# Patient Record
Sex: Male | Born: 1990 | Race: White | Hispanic: No | Marital: Single | Smoking: Never smoker
Health system: Southern US, Community
[De-identification: ages and names within clinical notes are randomized; demographics above are authoritative.]

## PROBLEM LIST (undated history)

## (undated) DIAGNOSIS — K219 Gastro-esophageal reflux disease without esophagitis: Secondary | ICD-10-CM

## (undated) DIAGNOSIS — F329 Major depressive disorder, single episode, unspecified: Secondary | ICD-10-CM

## (undated) DIAGNOSIS — R51 Headache: Secondary | ICD-10-CM

## (undated) DIAGNOSIS — E669 Obesity, unspecified: Secondary | ICD-10-CM

## (undated) DIAGNOSIS — R519 Headache, unspecified: Secondary | ICD-10-CM

## (undated) DIAGNOSIS — F32A Depression, unspecified: Secondary | ICD-10-CM

## (undated) HISTORY — DX: Major depressive disorder, single episode, unspecified: F32.9

## (undated) HISTORY — DX: Depression, unspecified: F32.A

## (undated) HISTORY — DX: Gastro-esophageal reflux disease without esophagitis: K21.9

## (undated) HISTORY — DX: Headache, unspecified: R51.9

## (undated) HISTORY — DX: Headache: R51

---
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
## 2011-04-08 ENCOUNTER — Inpatient Hospital Stay (INDEPENDENT_AMBULATORY_CARE_PROVIDER_SITE_OTHER)
Admission: RE | Admit: 2011-04-08 | Discharge: 2011-04-08 | Disposition: A | Discharge status: Home / Self Care | Payer: Self-pay | Source: Ambulatory Visit | Attending: Family Medicine | Admitting: Family Medicine

## 2011-04-08 DIAGNOSIS — B009 Herpesviral infection, unspecified: Secondary | ICD-10-CM

## 2012-01-24 ENCOUNTER — Emergency Department (HOSPITAL_COMMUNITY)
Admission: EM | Admit: 2012-01-24 | Discharge: 2012-01-24 | Disposition: A | Discharge status: Home / Self Care | Payer: Managed Care, Other (non HMO) | Source: Home / Self Care | Attending: Emergency Medicine | Admitting: Emergency Medicine

## 2012-01-24 NOTE — ED Notes (Signed)
Pt called x 1 no answer

## 2012-01-24 NOTE — ED Notes (Signed)
Pt called x2 no answer 

## 2012-01-25 ENCOUNTER — Encounter (HOSPITAL_COMMUNITY): Payer: Self-pay

## 2012-01-25 ENCOUNTER — Emergency Department (INDEPENDENT_AMBULATORY_CARE_PROVIDER_SITE_OTHER)
Admission: EM | Admit: 2012-01-25 | Discharge: 2012-01-25 | Disposition: A | Discharge status: Home Health | Payer: Managed Care, Other (non HMO) | Source: Home / Self Care | Attending: Family Medicine | Admitting: Family Medicine

## 2012-01-25 DIAGNOSIS — R112 Nausea with vomiting, unspecified: Secondary | ICD-10-CM

## 2012-01-25 HISTORY — DX: Obesity, unspecified: E66.9

## 2012-01-25 LAB — POCT I-STAT, CHEM 8
HCT: 51 % (ref 39.0–52.0)
Hemoglobin: 17.3 g/dL — ABNORMAL HIGH (ref 13.0–17.0)
Potassium: 3.5 mEq/L (ref 3.5–5.1)
Sodium: 140 mEq/L (ref 135–145)
TCO2: 27 mmol/L (ref 0–100)

## 2012-01-25 LAB — CBC
HCT: 47.7 % (ref 39.0–52.0)
MCHC: 34.4 g/dL (ref 30.0–36.0)
Platelets: 217 10*3/uL (ref 150–400)
RDW: 13.6 % (ref 11.5–15.5)

## 2012-01-25 MED ORDER — PROMETHAZINE HCL 25 MG PO TABS: 25.0000 mg | ORAL_TABLET | Freq: Four times a day (QID) | ORAL | Status: DC | PRN

## 2012-01-25 NOTE — ED Notes (Signed)
PT c/o abdominal pain for past 3 days with N/V/D.  Pt taking Pepto Bismol at home with no relief.  Pt states pain is generalized lower abdominal pain.

## 2012-01-25 NOTE — ED Provider Notes (Signed)
History     CSN: [id number]  Arrival date & time 01/25/12  4098   First MD Initiated Contact with Patient 01/25/12 0940      Chief Complaint  Patient presents with  . Abdominal Pain    (Consider location/radiation/quality/duration/timing/severity/associated sxs/prior treatment) HPI Comments: THE PATIENT REPORTS HAVING N/V/D X 3 DAYS. NO FEVER. NO BLOOD IN STOOL OR EMISIS. REPORTS CONSTANT LOWER ABD PAIN.. TOOK PEPTO LAST PM WITH SOME RELIEF.   The history is provided by the patient.    Past Medical History  Diagnosis Date  . Obesity     History reviewed. No pertinent past surgical history.  History reviewed. No pertinent family history.  History  Substance Use Topics  . Smoking status: Not on file  . Smokeless tobacco: Not on file  . Alcohol Use: No      Review of Systems  Constitutional: Positive for appetite change.  HENT: Negative.   Respiratory: Negative.   Cardiovascular: Negative.   Gastrointestinal: Positive for nausea, vomiting, abdominal pain and diarrhea. Negative for constipation, blood in stool and abdominal distention.  Genitourinary: Negative.   Musculoskeletal: Negative.   Skin: Negative.     Allergies  Review of patient's allergies indicates no known allergies.  Home Medications   Current Outpatient Rx  Name Route Sig Dispense Refill  . PROMETHAZINE HCL 25 MG PO TABS Oral Take 1 tablet (25 mg total) by mouth every 6 (six) hours as needed for nausea. 12 tablet 0    BP 133/85  Pulse 91  Temp(Src) 97.7 F (36.5 C) (Oral)  Resp 19  SpO2 96%  Physical Exam  Nursing note and vitals reviewed. Constitutional: He appears well-developed and well-nourished. He appears distressed.  HENT:  Head: Normocephalic and atraumatic.  Mouth/Throat: Oropharynx is clear and moist.  Neck: Normal range of motion. Neck supple. No thyromegaly present.  Cardiovascular: Normal rate and regular rhythm.   Pulmonary/Chest: Effort normal and breath sounds  normal.  Abdominal: Soft.       OBESE, GENERALIZED LOWER ABD TENDERNESS. NO REBOUNG. SLIGHT GUARDING. BS ACTIVE.   Musculoskeletal: Normal range of motion.  Skin: Skin is warm and dry.    ED Course  Procedures (including critical care time)  Labs Reviewed  POCT I-STAT, CHEM 8 - Abnormal; Notable for the following:    Glucose, Bld 108 (*)    Hemoglobin 17.3 (*)    All other components within normal limits  CBC   No results found.   1. Nausea vomiting and diarrhea       MDM          Randa Spike, MD 01/25/12 1025

## 2012-01-25 NOTE — Discharge Instructions (Signed)
CLEAR LIQUIDS , AVOID CAFFEINE AND MILK PRODUCTS. DIET AS DISCUSSED. B.R.A.T. Diet Your doctor has recommended the B.R.A.T. diet for you or your child until the condition improves. This is often used to help control diarrhea and vomiting symptoms. If you or your child can tolerate clear liquids, you may have:  Bananas.   Rice.   Applesauce.   Toast (and other simple starches such as crackers, potatoes, noodles).  Be sure to avoid dairy products, meats, and fatty foods until symptoms are better. Fruit juices such as apple, grape, and prune juice can make diarrhea worse. Avoid these. Continue this diet for 2 days or as instructed by your caregiver. Document Released: 09/25/2005 Document Revised: 09/14/2011 Document Reviewed: 03/14/2007 [facility] Patient Information 2012 [place], [place].

## 2012-07-12 ENCOUNTER — Encounter (HOSPITAL_COMMUNITY): Payer: Self-pay

## 2012-07-12 ENCOUNTER — Emergency Department (HOSPITAL_COMMUNITY)
Admission: EM | Admit: 2012-07-12 | Discharge: 2012-07-12 | Disposition: A | Discharge status: Home / Self Care | Payer: Managed Care, Other (non HMO) | Attending: Emergency Medicine | Admitting: Emergency Medicine

## 2012-07-12 ENCOUNTER — Emergency Department (HOSPITAL_COMMUNITY): Payer: Managed Care, Other (non HMO)

## 2012-07-12 DIAGNOSIS — E669 Obesity, unspecified: Secondary | ICD-10-CM | POA: Insufficient documentation

## 2012-07-12 DIAGNOSIS — Y998 Other external cause status: Secondary | ICD-10-CM | POA: Insufficient documentation

## 2012-07-12 DIAGNOSIS — W19XXXA Unspecified fall, initial encounter: Secondary | ICD-10-CM | POA: Insufficient documentation

## 2012-07-12 DIAGNOSIS — S99929A Unspecified injury of unspecified foot, initial encounter: Secondary | ICD-10-CM | POA: Insufficient documentation

## 2012-07-12 DIAGNOSIS — S8990XA Unspecified injury of unspecified lower leg, initial encounter: Secondary | ICD-10-CM | POA: Insufficient documentation

## 2012-07-12 DIAGNOSIS — Y9389 Activity, other specified: Secondary | ICD-10-CM | POA: Insufficient documentation

## 2012-07-12 MED ORDER — OXYCODONE-ACETAMINOPHEN 5-325 MG PO TABS
2.0000 | ORAL_TABLET | Freq: Once | ORAL | Status: AC
  Administered 2012-07-12: 2 via ORAL
  Filled 2012-07-12: qty 2

## 2012-07-12 MED ORDER — OXYCODONE-ACETAMINOPHEN 5-325 MG PO TABS: 1.0000 | ORAL_TABLET | Freq: Four times a day (QID) | ORAL | Status: DC | PRN

## 2012-07-12 NOTE — ED Notes (Signed)
Pt slipped and fell, injured his right knee sts he heard something popped,  no LOC. Now right knee pain 9/10. Pain worsen with movement. Limited ROM on right knee

## 2012-07-12 NOTE — ED Provider Notes (Signed)
History     CSN: [id number]  Arrival date & time 07/12/12  1203   First MD Initiated Contact with Patient 07/12/12 1255      Chief Complaint  Patient presents with  . Knee Pain    (Consider location/radiation/quality/duration/timing/severity/associated sxs/prior treatment) HPI Comments: Patient presents with right knee pain that occurred yesterday while helping his parents move. Patient states that while loading thing on to the trailer he stepped back and heard something pop. Patient states that the pain is 9/10 and worse with extension of the knee. Patient states that he cannot put pressure on the leg and has to "hop" around. Denies fever or chills. Denies numbness or tingling.  The history is provided by the patient. No language interpreter was used.    Past Medical History  Diagnosis Date  . Obesity     History reviewed. No pertinent past surgical history.  No family history on file.  History  Substance Use Topics  . Smoking status: Not on file  . Smokeless tobacco: Not on file  . Alcohol Use: No      Review of Systems  Constitutional: Negative for fever and chills.  Gastrointestinal: Negative for nausea, vomiting, abdominal pain and diarrhea.  Musculoskeletal: Positive for back pain and gait problem.  Neurological: Positive for numbness.    Allergies  Review of patient's allergies indicates no known allergies.  Home Medications   Current Outpatient Rx  Name Route Sig Dispense Refill  . HYDROCODONE-ACETAMINOPHEN 5-325 MG PO TABS Oral Take 0.5 tablets by mouth once as needed. Pain.    . OXYCODONE-ACETAMINOPHEN 5-325 MG PO TABS Oral Take 1 tablet by mouth every 6 (six) hours as needed for pain. 15 tablet 0    BP 136/73  Pulse 73  Temp 97.6 F (36.4 C) (Oral)  Resp 18  Wt 325 lb (147.419 kg)  SpO2 100%  Physical Exam  Nursing note and vitals reviewed. Constitutional: He appears well-developed and well-nourished. No distress.  HENT:  Head:  Normocephalic and atraumatic.  Mouth/Throat: Oropharynx is clear and moist.  Eyes: Conjunctivae normal and EOM are normal. No scleral icterus.  Neck: Normal range of motion. Neck supple.  Cardiovascular: Normal rate, regular rhythm, normal heart sounds and intact distal pulses.   Pulmonary/Chest: Effort normal and breath sounds normal.  Abdominal: Soft. Bowel sounds are normal.  Musculoskeletal: He exhibits tenderness. He exhibits no edema.       Patient has tenderness to palpation of the patella. Patient has pain with flexion. ROM limited by pain.  Neurological: He is alert. He exhibits normal muscle tone. Coordination abnormal.  Skin: Skin is warm and dry.    ED Course  Procedures (including critical care time)  Labs Reviewed - No data to display Dg Knee Complete 4 Views Right  07/12/2012  *RADIOLOGY REPORT*  Clinical Data: History of twisting injury.  Inability to straighten knee.  RIGHT KNEE - COMPLETE 4+ VIEW  Comparison: 07/22/2007.  Findings: No definite joint effusion is evident. Alignment is normal.  Joint spaces are preserved.  No fracture or dislocation is evident.  No soft tissue lesions are seen.  IMPRESSION: No fracture or dislocation is evident.   Original Report Authenticated By: Crawford Givens, M.D.      1. Knee injury       MDM  Patient presented with acute right knee pain after a fall while moving. Patient given pain medication in ED with improvement. Imaging unremarkable for dislocation or fracture. Patient put in knee immobilizer and given crutches  and pain medication. Patient referred to ortho on call for follow-up on Monday. Return precautions given verbally and in discharge summary.        Pixie Casino, PA-C 07/12/12 1821

## 2012-07-13 NOTE — ED Provider Notes (Signed)
Medical screening examination/treatment/procedure(s) were performed by non-physician practitioner and as supervising physician I was immediately available for consultation/collaboration.  Ashland Wiseman R. Tranesha Lessner, MD 07/13/12 0656 

## 2012-11-26 ENCOUNTER — Encounter: Payer: Self-pay | Admitting: Internal Medicine

## 2012-11-26 ENCOUNTER — Ambulatory Visit (INDEPENDENT_AMBULATORY_CARE_PROVIDER_SITE_OTHER): Payer: Managed Care, Other (non HMO) | Admitting: Internal Medicine

## 2012-11-26 VITALS — BP 140/90 | HR 88 | Temp 98.0°F | Resp 18 | Wt 342.0 lb

## 2012-11-26 DIAGNOSIS — Z0000 Encounter for general adult medical examination without abnormal findings: Secondary | ICD-10-CM

## 2012-11-26 DIAGNOSIS — E6609 Other obesity due to excess calories: Secondary | ICD-10-CM

## 2012-11-26 DIAGNOSIS — F329 Major depressive disorder, single episode, unspecified: Secondary | ICD-10-CM

## 2012-11-26 DIAGNOSIS — E669 Obesity, unspecified: Secondary | ICD-10-CM

## 2012-11-26 LAB — GLUCOSE, POCT (MANUAL RESULT ENTRY): POC Glucose: 107 mg/dl — AB (ref 70–99)

## 2012-11-26 MED ORDER — BUPROPION HCL ER (XL) 300 MG PO TB24: 300.0000 mg | ORAL_TABLET | Freq: Every day | ORAL | Status: DC

## 2012-11-26 NOTE — Progress Notes (Signed)
  Subjective:    Patient ID: Jermaine Wang, male    DOB: [birth year], 22 y.o.   MRN: [id number]  HPI   22 year old patient who is seen today to establish with our practice. His aunt Jeison Delpilar is followed at this practice. He was treated for depression at age 22 or 71 and did quite well on Wellbutrin at a dose of 300 mg daily. His chief complaint today is worsening depression. He also complains of some mild headaches and reflux symptoms. Past medical history is fairly unremarkable. No hospital admissions  Family history father has had 2 surgeries for a brain tumor, apparently benign and associated with hydrocephalus. He is 23 years of age with hypertension and otherwise does quite well Mother age 73 has hypertension and diabetes one sister is well.  Social history single nonsmoker born and raised in [place] graduated [employer]. 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  HENT: Negative for hearing loss, ear pain, congestion, sore throat, trouble swallowing, neck stiffness, dental problem, voice change and tinnitus.   Eyes: Negative for pain, discharge and visual disturbance.  Respiratory: Negative for cough, chest tightness, wheezing and stridor.   Cardiovascular: Negative for chest pain, palpitations and leg swelling.  Gastrointestinal: Negative for nausea, vomiting, abdominal pain, diarrhea, constipation, blood in stool and abdominal distention.  Genitourinary: Negative for urgency, hematuria, flank pain, discharge, difficulty urinating and genital sores.  Musculoskeletal: Negative for myalgias, back pain, joint swelling, arthralgias and gait problem.  Skin: Negative for rash.  Neurological: Positive for headaches. Negative for dizziness, syncope, speech difficulty, weakness and numbness.  Hematological: Negative for adenopathy. Does not bruise/bleed easily.   Psychiatric/Behavioral: Positive for dysphoric mood. Negative for behavioral problems. The patient is not nervous/anxious.        Objective:   Physical Exam  Constitutional: He appears well-developed and well-nourished.  Weight 342 Blood pressure 130/80  HENT:  Head: Normocephalic and atraumatic.  Right Ear: External ear normal.  Left Ear: External ear normal.  Nose: Nose normal.  Mouth/Throat: Oropharynx is clear and moist.  Eyes: Conjunctivae and EOM are normal. Pupils are equal, round, and reactive to light. No scleral icterus.  Neck: Normal range of motion. Neck supple. No JVD present. No thyromegaly present.  Cardiovascular: Regular rhythm, normal heart sounds and intact distal pulses.  Exam reveals no gallop and no friction rub.   No murmur heard. Pulmonary/Chest: Effort normal and breath sounds normal. He exhibits no tenderness.  Abdominal: Soft. Bowel sounds are normal. He exhibits no distension and no mass. There is no tenderness.  Musculoskeletal: Normal range of motion. He exhibits no edema and no tenderness.  Lymphadenopathy:    He has no cervical adenopathy.  Neurological: He is alert. He has normal reflexes. No cranial nerve deficit. Coordination normal.  Skin: Skin is warm and dry. No rash noted.  Psychiatric: He has a normal mood and affect. His behavior is normal.          Assessment & Plan:    preventive health examination Exogenous obesity. Exercise weight loss encouraged dietary instructions dispensed Recurrent depression. Will resume Wellbutrin with a final dose of 300 mg daily  Recheck 8 weeks

## 2012-11-26 NOTE — Patient Instructions (Signed)
Avoids foods high in acid such as tomatoes citrus juices, and spicy foods.  Avoid eating within two hours of lying down or before exercising.  Do not overheat.  Try smaller more frequent meals.  If symptoms persist, elevate the head of her bed 12 inches while sleeping.  Diet for Gastroesophageal Reflux Disease, Adult Reflux (acid reflux) is when acid from your stomach flows up into the esophagus. When acid comes in contact with the esophagus, the acid causes irritation and soreness (inflammation) in the esophagus. When reflux happens often or so severely that it causes damage to the esophagus, it is called gastroesophageal reflux disease (GERD). Nutrition therapy can help ease the discomfort of GERD. FOODS OR DRINKS TO AVOID OR LIMIT  Smoking or chewing tobacco. Nicotine is one of the most potent stimulants to acid production in the gastrointestinal tract.  Caffeinated and decaffeinated coffee and black tea.  Regular or low-calorie carbonated beverages or energy drinks (caffeine-free carbonated beverages are allowed).   Strong spices, such as black pepper, white pepper, red pepper, cayenne, curry powder, and chili powder.  Peppermint or spearmint.  Chocolate.  High-fat foods, including meats and fried foods. Extra added fats including oils, butter, salad dressings, and nuts. Limit these to less than 8 tsp per day.  Fruits and vegetables if they are not tolerated, such as citrus fruits or tomatoes.  Alcohol.  Any food that seems to aggravate your condition. If you have questions regarding your diet, call your caregiver or a registered dietitian. OTHER THINGS THAT MAY HELP GERD INCLUDE:   Eating your meals slowly, in a relaxed setting.  Eating 5 to 6 small meals per day instead of 3 large meals.  Eliminating food for a period of time if it causes distress.  Not lying down until 3 hours after eating a meal.  Keeping the head of your bed raised 6 to 9 inches (15 to 23 cm) by using a  foam wedge or blocks under the legs of the bed. Lying flat may make symptoms worse.  Being physically active. Weight loss may be helpful in reducing reflux in overweight or obese adults.  Wear loose fitting clothing EXAMPLE MEAL PLAN This meal plan is approximately 2,000 calories based on https://www.bernard.org/ meal planning guidelines. Breakfast   cup cooked oatmeal.  1 cup strawberries.  1 cup low-fat milk.  1 oz almonds. Snack  1 cup cucumber slices.  6 oz yogurt (made from low-fat or fat-free milk). Lunch  2 slice whole-wheat bread.  2 oz sliced [place].  2 tsp mayonnaise.  1 cup blueberries.  1 cup snap peas. Snack  6 whole-wheat crackers.  1 oz string cheese. Dinner   cup brown rice.  1 cup mixed veggies.  1 tsp olive oil.  3 oz grilled fish. Document Released: 09/25/2005 Document Revised: 12/18/2011 Document Reviewed: 08/11/2011 [facility] Patient Information 2013 [place], [place]. DASH Diet The DASH diet stands for "Dietary Approaches to Stop Hypertension." It is a healthy eating plan that has been shown to reduce high blood pressure (hypertension) in as little as 14 days, while also possibly providing other significant health benefits. These other health benefits include reducing the risk of breast cancer after menopause and reducing the risk of type 2 diabetes, heart disease, colon cancer, and stroke. Health benefits also include weight loss and slowing kidney failure in patients with chronic kidney disease.  DIET GUIDELINES  Limit salt (sodium). Your diet should contain less than 1500 mg of sodium daily.  Limit refined or processed carbohydrates.  Your diet should include mostly whole grains. Desserts and added sugars should be used sparingly.  Include small amounts of heart-healthy fats. These types of fats include nuts, oils, and tub margarine. Limit saturated and trans fats. These fats have been shown to be harmful in the body. CHOOSING FOODS  The  following food groups are based on a 2000 calorie diet. See your Registered Dietitian for individual calorie needs. Grains and Grain Products (6 to 8 servings daily)  Eat More Often: Whole-wheat bread, brown rice, whole-grain or wheat pasta, quinoa, popcorn without added fat or salt (air popped).  Eat Less Often: White bread, white pasta, white rice, cornbread. Vegetables (4 to 5 servings daily)  Eat More Often: Fresh, frozen, and canned vegetables. Vegetables may be raw, steamed, roasted, or grilled with a minimal amount of fat.  Eat Less Often/Avoid: Creamed or fried vegetables. Vegetables in a cheese sauce. Fruit (4 to 5 servings daily)  Eat More Often: All fresh, canned (in natural juice), or frozen fruits. Dried fruits without added sugar. One hundred percent fruit juice ( cup [237 mL] daily).  Eat Less Often: Dried fruits with added sugar. Canned fruit in light or heavy syrup. [employer], Fish, and Poultry (2 servings or less daily. One serving is 3 to 4 oz [85-114 g]).  Eat More Often: Ninety percent or leaner ground beef, tenderloin, sirloin. Round cuts of beef, chicken breast, [place] breast. All fish. Grill, bake, or broil your meat. Nothing should be fried.  Eat Less Often/Avoid: Fatty cuts of meat, [place], or chicken leg, thigh, or wing. Fried cuts of meat or fish. Dairy (2 to 3 servings)  Eat More Often: Low-fat or fat-free milk, low-fat plain or light yogurt, reduced-fat or part-skim cheese.  Eat Less Often/Avoid: Milk (whole, 2%).Whole milk yogurt. Full-fat cheeses. Nuts, Seeds, and Legumes (4 to 5 servings per week)  Eat More Often: All without added salt.  Eat Less Often/Avoid: Salted nuts and seeds, canned beans with added salt. Fats and Sweets (limited)  Eat More Often: Vegetable oils, tub margarines without trans fats, sugar-free gelatin. Mayonnaise and salad dressings.  Eat Less Often/Avoid: Coconut oils, palm oils, butter, stick margarine, cream, half and  half, cookies, candy, pie. FOR MORE INFORMATION The Dash Diet Eating Plan: www.dashdiet.org Document Released: 09/14/2011 Document Revised: 12/18/2011 Document Reviewed: 09/14/2011 [facility] Patient Information 2013 [place], [place].

## 2013-01-29 ENCOUNTER — Ambulatory Visit: Payer: Managed Care, Other (non HMO) | Admitting: Internal Medicine

## 2013-10-15 ENCOUNTER — Ambulatory Visit (INDEPENDENT_AMBULATORY_CARE_PROVIDER_SITE_OTHER): Payer: BC Managed Care – PPO | Admitting: Internal Medicine

## 2013-10-15 ENCOUNTER — Encounter: Payer: Self-pay | Admitting: Internal Medicine

## 2013-10-15 VITALS — BP 130/88 | HR 74 | Temp 98.1°F | Resp 20 | Wt 338.0 lb

## 2013-10-15 DIAGNOSIS — F329 Major depressive disorder, single episode, unspecified: Secondary | ICD-10-CM

## 2013-10-15 DIAGNOSIS — F32A Depression, unspecified: Secondary | ICD-10-CM

## 2013-10-15 DIAGNOSIS — E669 Obesity, unspecified: Secondary | ICD-10-CM

## 2013-10-15 DIAGNOSIS — B9789 Other viral agents as the cause of diseases classified elsewhere: Secondary | ICD-10-CM

## 2013-10-15 DIAGNOSIS — J069 Acute upper respiratory infection, unspecified: Secondary | ICD-10-CM

## 2013-10-15 DIAGNOSIS — F3289 Other specified depressive episodes: Secondary | ICD-10-CM

## 2013-10-15 DIAGNOSIS — E6609 Other obesity due to excess calories: Secondary | ICD-10-CM

## 2013-10-15 MED ORDER — HYDROCODONE-HOMATROPINE 5-1.5 MG/5ML PO SYRP: 5.0000 mL | ORAL_SOLUTION | Freq: Four times a day (QID) | ORAL | Status: AC | PRN

## 2013-10-15 NOTE — Progress Notes (Signed)
Subjective:    Patient ID: Jermaine Wang, male    DOB: 24-Jun-1991, 24 y.o.   MRN: [id number]  HPI  23 year old patient who presents with a four-day history of sore throat cough left ear discomfort. He has been using Robitussin-DM with some benefit. No fever. His wife has been ill.  Past Medical History  Diagnosis Date  . Obesity   . Depression   . Generalized headaches   . GERD (gastroesophageal reflux disease)     History   Social History  . Marital Status: Single    Spouse Name: N/A    Number of Children: N/A  . Years of Education: N/A   Occupational History  . Not on file.   Social History Main Topics  . Smoking status: Former Smoker    Types: Cigarettes    Quit date: 10/09/2006  . Smokeless tobacco: Never Used  . Alcohol Use: No  . Drug Use: No  . Sexual Activity: Not on file   Other Topics Concern  . Not on file   Social History Narrative  . No narrative on file    History reviewed. No pertinent past surgical history.  Family History  Problem Relation Age of Onset  . Hypertension Mother   . Heart disease Father   . Hypertension Father   . Cancer Paternal Grandmother   . Stroke Paternal Grandmother     No Known Allergies  Current Outpatient Prescriptions on File Prior to Visit  Medication Sig Dispense Refill  . buPROPion (WELLBUTRIN XL) 300 MG 24 hr tablet Take 1 tablet (300 mg total) by mouth daily.  90 tablet  2   No current facility-administered medications on file prior to visit.    BP 130/88  Pulse 74  Temp(Src) 98.1 F (36.7 C) (Oral)  Resp 20  Wt 338 lb (153.316 kg)  SpO2 98%       Review of Systems  Constitutional: Positive for activity change, appetite change and fatigue. Negative for fever and chills.  HENT: Positive for postnasal drip, rhinorrhea and sinus pressure. Negative for congestion, dental problem, ear pain, hearing loss, sore throat, tinnitus, trouble swallowing and voice change.   Eyes: Negative for pain, discharge  and visual disturbance.  Respiratory: Positive for cough. Negative for chest tightness, wheezing and stridor.   Cardiovascular: Negative for chest pain, palpitations and leg swelling.  Gastrointestinal: Negative for nausea, vomiting, abdominal pain, diarrhea, constipation, blood in stool and abdominal distention.  Genitourinary: Negative for urgency, hematuria, flank pain, discharge, difficulty urinating and genital sores.  Musculoskeletal: Negative for arthralgias, back pain, gait problem, joint swelling, myalgias and neck stiffness.  Skin: Negative for rash.  Neurological: Negative for dizziness, syncope, speech difficulty, weakness, numbness and headaches.  Hematological: Negative for adenopathy. Does not bruise/bleed easily.  Psychiatric/Behavioral: Negative for behavioral problems and dysphoric mood. The patient is not nervous/anxious.        Objective:   Physical Exam  Constitutional: He is oriented to person, place, and time. He appears well-developed.  Afebrile. No distress. Repeat blood pressure 130/80  HENT:  Head: Normocephalic.  Right Ear: External ear normal.  Left Ear: External ear normal.  Mild erythema with pharyngeal crowding  Eyes: Conjunctivae and EOM are normal.  Neck: Normal range of motion.  Cardiovascular: Normal rate and normal heart sounds.   Pulmonary/Chest: Breath sounds normal.  Abdominal: Bowel sounds are normal.  Musculoskeletal: Normal range of motion. He exhibits no edema and no tenderness.  Neurological: He is alert and oriented to person, place,  and time.  Psychiatric: He has a normal mood and affect. His behavior is normal.          Assessment & Plan:  Viral URI with cough. We'll treat symptomatically Exogenous obesity. Weight loss encouraged Depression stable

## 2013-10-15 NOTE — Patient Instructions (Signed)
Acute bronchitis symptoms for less than 10 days are generally not helped by antibiotics.  Take over-the-counter expectorants and cough medications such as  Mucinex DM.  Call if there is no improvement in 5 to 7 days or if he developed worsening cough, fever, or new symptoms, such as shortness of breath or chest pain.    

## 2013-10-15 NOTE — Progress Notes (Signed)
Pre-visit discussion using our clinic review tool. No additional management support is needed unless otherwise documented below in the visit note.  

## 2014-07-14 ENCOUNTER — Encounter: Payer: Self-pay | Admitting: Internal Medicine

## 2014-07-14 ENCOUNTER — Ambulatory Visit (INDEPENDENT_AMBULATORY_CARE_PROVIDER_SITE_OTHER): Payer: BC Managed Care – PPO | Admitting: Internal Medicine

## 2014-07-14 VITALS — BP 110/80 | HR 87 | Temp 97.8°F | Resp 20 | Ht 70.0 in | Wt 328.0 lb

## 2014-07-14 DIAGNOSIS — K529 Noninfective gastroenteritis and colitis, unspecified: Secondary | ICD-10-CM

## 2014-07-14 MED ORDER — DIPHENOXYLATE-ATROPINE 2.5-0.025 MG PO TABS: 1.0000 | ORAL_TABLET | Freq: Four times a day (QID) | ORAL | Status: DC | PRN

## 2014-07-14 NOTE — Patient Instructions (Signed)
Maintain hydration by drinking small amounts of clear fluids frequently, then soft diet, and then advance diet as tolerated. May use OTC Imodium if desired for any diarrhea.  Call if symptoms worsen, high fever, severe weakness or fainting, increased abdominal pain, blood in stool or vomit, or failure to improve in 2-3 days.Viral Gastroenteritis Viral gastroenteritis is also known as stomach flu. This condition affects the stomach and intestinal tract. It can cause sudden diarrhea and vomiting. The illness typically lasts 3 to 8 days. Most people develop an immune response that eventually gets rid of the virus. While this natural response develops, the virus can make you quite ill. CAUSES  Many different viruses can cause gastroenteritis, such as rotavirus or noroviruses. You can catch one of these viruses by consuming contaminated food or water. You may also catch a virus by sharing utensils or other personal items with an infected person or by touching a contaminated surface. SYMPTOMS  The most common symptoms are diarrhea and vomiting. These problems can cause a severe loss of body fluids (dehydration) and a body salt (electrolyte) imbalance. Other symptoms may include:  Fever.  Headache.  Fatigue.  Abdominal pain. DIAGNOSIS  Your caregiver can usually diagnose viral gastroenteritis based on your symptoms and a physical exam. A stool sample may also be taken to test for the presence of viruses or other infections. TREATMENT  This illness typically goes away on its own. Treatments are aimed at rehydration. The most serious cases of viral gastroenteritis involve vomiting so severely that you are not able to keep fluids down. In these cases, fluids must be given through an intravenous line (IV). HOME CARE INSTRUCTIONS   Drink enough fluids to keep your urine clear or pale yellow. Drink small amounts of fluids frequently and increase the amounts as tolerated.  Ask your caregiver for specific  rehydration instructions.  Avoid:  Foods high in sugar.  Alcohol.  Carbonated drinks.  Tobacco.  Juice.  Caffeine drinks.  Extremely hot or cold fluids.  Fatty, greasy foods.  Too much intake of anything at one time.  Dairy products until 24 to 48 hours after diarrhea stops.  You may consume probiotics. Probiotics are active cultures of beneficial bacteria. They may lessen the amount and number of diarrheal stools in adults. Probiotics can be found in yogurt with active cultures and in supplements.  Wash your hands well to avoid spreading the virus.  Only take over-the-counter or prescription medicines for pain, discomfort, or fever as directed by your caregiver. Do not give aspirin to children. Antidiarrheal medicines are not recommended.  Ask your caregiver if you should continue to take your regular prescribed and over-the-counter medicines.  Keep all follow-up appointments as directed by your caregiver. SEEK IMMEDIATE MEDICAL CARE IF:   You are unable to keep fluids down.  You do not urinate at least once every 6 to 8 hours.  You develop shortness of breath.  You notice blood in your stool or vomit. This may look like coffee grounds.  You have abdominal pain that increases or is concentrated in one small area (localized).  You have persistent vomiting or diarrhea.  You have a fever.  The patient is a child younger than 3 months, and he or she has a fever.  The patient is a child older than 3 months, and he or she has a fever and persistent symptoms.  The patient is a child older than 3 months, and he or she has a fever and symptoms suddenly get  worse.  The patient is a baby, and he or she has no tears when crying. MAKE SURE YOU:   Understand these instructions.  Will watch your condition.  Will get help right away if you are not doing well or get worse. Document Released: 09/25/2005 Document Revised: 12/18/2011 Document Reviewed:  07/12/2011 [facility] Patient Information 2015 [employer] [place]. This information is not intended to replace advice given to you by your health care provider. Make sure you discuss any questions you have with your health care provider.

## 2014-07-14 NOTE — Progress Notes (Signed)
Subjective:    Patient ID: Jermaine Wang, male    DOB: October 15, 1990, 23 y.o.   MRN: [id number]  HPI 23 year old patient who presents with a four-day history of watery diarrhea.  Symptoms are improving and yesterday he only had 4-5 loose bowel movements per 24 hour period early course of the illness.  He had some mild abdominal discomfort and rare nausea and vomiting.  Yesterday he returned to work and was able to work a full day.  No diarrhea throughout the night last night. Patient also complains of left-sided chest pain of several days' duration.  Described as a sharp, shooting pain to the left scapular area.  No shortness of breath. His job is very physically demanding in a warehouse  Past Medical History  Diagnosis Date  . Obesity   . Depression   . Generalized headaches   . GERD (gastroesophageal reflux disease)     History   Social History  . Marital Status: Single    Spouse Name: N/A    Number of Children: N/A  . Years of Education: N/A   Occupational History  . Not on file.   Social History Main Topics  . Smoking status: Former Smoker    Types: Cigarettes    Quit date: 10/09/2006  . Smokeless tobacco: Never Used  . Alcohol Use: No  . Drug Use: No  . Sexual Activity: Not on file   Other Topics Concern  . Not on file   Social History Narrative  . No narrative on file    History reviewed. No pertinent past surgical history.  Family History  Problem Relation Age of Onset  . Hypertension Mother   . Heart disease Father   . Hypertension Father   . Cancer Paternal Grandmother   . Stroke Paternal Grandmother     No Known Allergies  Current Outpatient Prescriptions on File Prior to Visit  Medication Sig Dispense Refill  . buPROPion (WELLBUTRIN XL) 300 MG 24 hr tablet Take 1 tablet (300 mg total) by mouth daily.  90 tablet  2   No current facility-administered medications on file prior to visit.    BP 110/80  Pulse 87  Temp(Src) 97.8 F (36.6 C) (Oral)   Resp 20  Ht 5\' 10"  (1.778 m)  Wt 328 lb (148.78 kg)  BMI 47.06 kg/m2  SpO2 98%       Review of Systems  Constitutional: Negative for fever, chills, appetite change and fatigue.  HENT: Negative for congestion, dental problem, ear pain, hearing loss, sore throat, tinnitus, trouble swallowing and voice change.   Eyes: Negative for pain, discharge and visual disturbance.  Respiratory: Negative for cough, chest tightness, wheezing and stridor.   Cardiovascular: Positive for chest pain. Negative for palpitations and leg swelling.  Gastrointestinal: Positive for nausea, vomiting, abdominal pain and diarrhea. Negative for constipation, blood in stool and abdominal distention.  Genitourinary: Negative for urgency, hematuria, flank pain, discharge, difficulty urinating and genital sores.  Musculoskeletal: Negative for arthralgias, back pain, gait problem, joint swelling, myalgias and neck stiffness.  Skin: Negative for rash.  Neurological: Negative for dizziness, syncope, speech difficulty, weakness, numbness and headaches.  Hematological: Negative for adenopathy. Does not bruise/bleed easily.  Psychiatric/Behavioral: Negative for behavioral problems and dysphoric mood. The patient is not nervous/anxious.        Objective:   Physical Exam  Constitutional: He is oriented to person, place, and time. He appears well-developed.  Obese afebrile No distress.  Blood pressure mid sixties on repeat  HENT:  Head: Normocephalic.  Right Ear: External ear normal.  Left Ear: External ear normal.  Eyes: Conjunctivae and EOM are normal.  Neck: Normal range of motion.  Cardiovascular: Normal rate, regular rhythm and normal heart sounds.   Pulmonary/Chest: Breath sounds normal. No respiratory distress. He has no wheezes. He has no rales. He exhibits no tenderness.  Abdominal: Soft. Bowel sounds are normal. He exhibits no distension. There is tenderness. There is no rebound and no guarding.  Very  mild tenderness in the left lower quadrant  Musculoskeletal: Normal range of motion. He exhibits no edema and no tenderness.  Neurological: He is alert and oriented to person, place, and time.  Psychiatric: He has a normal mood and affect. His behavior is normal.          Assessment & Plan:   Resolving viral gastroenteritis.   Nonspecific chest wall pain.  We'll place on a bland diet and observe Exogenous obesity

## 2014-07-14 NOTE — Progress Notes (Signed)
Pre visit review using our clinic review tool, if applicable. No additional management support is needed unless otherwise documented below in the visit note. 

## 2015-08-28 ENCOUNTER — Ambulatory Visit (INDEPENDENT_AMBULATORY_CARE_PROVIDER_SITE_OTHER): Payer: BLUE CROSS/BLUE SHIELD | Admitting: Internal Medicine

## 2015-08-28 DIAGNOSIS — H00033 Abscess of eyelid right eye, unspecified eyelid: Secondary | ICD-10-CM | POA: Diagnosis not present

## 2015-08-28 MED ORDER — CEPHALEXIN 500 MG PO CAPS: 500.0000 mg | ORAL_CAPSULE | Freq: Three times a day (TID) | ORAL | Status: DC

## 2015-08-28 NOTE — Progress Notes (Signed)
   Subjective:    Patient ID: Jermaine Wang, male    DOB: 12-Oct-1990, 24 y.o.   MRN: [id number] This chart was scribed for Ellamae Siaobert Loden Laurent, MD by Jolene Provostobert Halas, Medical Scribe. This patient was seen in Room 4 and the patient's care was started a 1:22 PM.  Chief Complaint  Patient presents with  . Conjunctivitis    x 1 day  . Eye Pain    x 1 day  . Belepharitis    this am  . Eye Drainage    this am    HPI HPI Comments: Jermaine RutherfordJesse Fouty is a 24 y.o. male [facility] of eye pain that started yesterday. Yesterday he thought he had something in his eye, and he rinsed his eye out at that time and felt better, but he woke up this morning and his eye was partially swollen shut and had significant dried discharge around the lid. He denies having any visual disturbance associated with his sx.    Review of Systems  Constitutional: Negative for fever and chills.  Eyes: Positive for pain and discharge. Negative for visual disturbance.  Neurological: Negative for headaches.       Objective:  BP 124/84 mmHg  Pulse 74  Temp(Src) 98.3 F (36.8 C) (Oral)  Resp 18  Ht 5\' 9"  (1.753 m)  Wt 337 lb (152.862 kg)  BMI 49.74 kg/m2  SpO2 98%  Physical Exam  Constitutional: He is oriented to person, place, and time. He appears well-developed and well-nourished. No distress.  HENT:  Head: Normocephalic and atraumatic.  Eyes: Pupils are equal, round, and reactive to light.  The right upper lid is red, swollen and tender with a punctate area of hemorrhage on the outer margin.  PEERLA. EOMs conj  After Opthaine and staining the conjunctiva appears normal.   Neck: Neck supple.  Cardiovascular: Normal rate.   Pulmonary/Chest: Effort normal.  Lymphadenopathy:    He has no cervical adenopathy.  Neurological: He is alert and oriented to person, place, and time.  Skin: No rash noted. He is not diaphoretic.  Psychiatric: He has a normal mood and affect. His behavior is normal.  Nursing  note and vitals reviewed.      Assessment & Plan:  Cellulitis of eyelid, right  Meds ordered this encounter  Medications  . cephALEXin (KEFLEX) 500 MG capsule    Sig: Take 1 capsule (500 mg total) by mouth 3 (three) times daily.    Dispense:  21 capsule    Refill:  0   Will send back to PCP Amador CunasKwiatkowski for Td update and work on BMI  I have completed the patient encounter in its entirety as documented by the scribe, with editing by me where necessary. Emin Foree P. Merla Richesoolittle, M.D.   By signing my name below, I, Javier Dockerobert Ryan Halas, attest that this documentation has been prepared under the direction and in the presence of Ellamae Siaobert Zeidy Tayag, MD. Electronically Signed: Javier Dockerobert Ryan Halas, ER Scribe. 08/28/2015. 1:25 PM.

## 2015-12-08 ENCOUNTER — Telehealth: Payer: Self-pay | Admitting: Internal Medicine

## 2015-12-08 ENCOUNTER — Ambulatory Visit (INDEPENDENT_AMBULATORY_CARE_PROVIDER_SITE_OTHER): Payer: BLUE CROSS/BLUE SHIELD | Admitting: Family Medicine

## 2015-12-08 VITALS — BP 128/80 | HR 79 | Temp 98.4°F | Resp 18 | Ht 69.5 in | Wt 350.0 lb

## 2015-12-08 DIAGNOSIS — B370 Candidal stomatitis: Secondary | ICD-10-CM

## 2015-12-08 DIAGNOSIS — G510 Bell's palsy: Secondary | ICD-10-CM

## 2015-12-08 LAB — COMPREHENSIVE METABOLIC PANEL
ALT: 36 U/L (ref 9–46)
AST: 20 U/L (ref 10–40)
Albumin: 4.3 g/dL (ref 3.6–5.1)
Alkaline Phosphatase: 75 U/L (ref 40–115)
BUN: 11 mg/dL (ref 7–25)
CHLORIDE: 103 mmol/L (ref 98–110)
CO2: 29 mmol/L (ref 20–31)
CREATININE: 0.91 mg/dL (ref 0.60–1.35)
Calcium: 9 mg/dL (ref 8.6–10.3)
Glucose, Bld: 90 mg/dL (ref 65–99)
POTASSIUM: 4.4 mmol/L (ref 3.5–5.3)
SODIUM: 142 mmol/L (ref 135–146)
Total Bilirubin: 0.5 mg/dL (ref 0.2–1.2)
Total Protein: 6.8 g/dL (ref 6.1–8.1)

## 2015-12-08 LAB — GLUCOSE, POCT (MANUAL RESULT ENTRY): POC Glucose: 89 mg/dl (ref 70–99)

## 2015-12-08 LAB — POCT CBC
Granulocyte percent: 64.4 %G (ref 37–80)
HCT, POC: 44.3 % (ref 43.5–53.7)
HEMOGLOBIN: 15.8 g/dL (ref 14.1–18.1)
LYMPH, POC: 2.6 (ref 0.6–3.4)
MCH, POC: 30.1 pg (ref 27–31.2)
MCHC: 35.7 g/dL — AB (ref 31.8–35.4)
MCV: 84.2 fL (ref 80–97)
MID (CBC): 0.4 (ref 0–0.9)
MPV: 7.4 fL (ref 0–99.8)
POC GRANULOCYTE: 5.5 (ref 2–6.9)
POC LYMPH PERCENT: 30.9 %L (ref 10–50)
POC MID %: 4.7 % (ref 0–12)
Platelet Count, POC: 248 10*3/uL (ref 142–424)
RBC: 5.26 M/uL (ref 4.69–6.13)
RDW, POC: 13.4 %
WBC: 8.5 10*3/uL (ref 4.6–10.2)

## 2015-12-08 LAB — POCT SEDIMENTATION RATE: POCT SED RATE: 6 mm/hr (ref 0–22)

## 2015-12-08 LAB — HIV ANTIBODY (ROUTINE TESTING W REFLEX): HIV: NONREACTIVE

## 2015-12-08 LAB — POCT GLYCOSYLATED HEMOGLOBIN (HGB A1C): HEMOGLOBIN A1C: 5.1

## 2015-12-08 MED ORDER — VALACYCLOVIR HCL 1 G PO TABS: 1000.0000 mg | ORAL_TABLET | Freq: Three times a day (TID) | ORAL | Status: DC

## 2015-12-08 MED ORDER — PREDNISONE 20 MG PO TABS: ORAL_TABLET | ORAL | Status: DC

## 2015-12-08 MED ORDER — NYSTATIN 100000 UNIT/ML MT SUSP: 5.0000 mL | Freq: Four times a day (QID) | OROMUCOSAL | Status: DC

## 2015-12-08 NOTE — Telephone Encounter (Signed)
See below. Patient is currently at [facility].

## 2015-12-08 NOTE — Patient Instructions (Addendum)
Because you received labwork today, you will receive an invoice from [employer]. Please contact [facility] at [phone] with questions or concerns regarding your invoice. Our billing staff will not be able to assist you with those questions.  You will be contacted with the lab results as soon as they are available. The fastest way to get your results is to activate your My Chart account. Instructions are located on the last page of this paperwork. If you have not heard from [place] regarding the results in 2 weeks, please contact this office.   Bell Palsy Bell palsy is a condition in which the muscles on one side of the face become paralyzed. This often causes one side of the face to droop. It is a common condition and most people recover completely. RISK FACTORS Risk factors for Bell palsy include:  Pregnancy.  Diabetes.  An infection by a virus, such as infections that cause cold sores. CAUSES  Bell palsy is caused by damage to or inflammation of a nerve in your face. It is unclear why this happens, but an infection by a virus may lead to it. Most of the time the reason it happens is unknown. SIGNS AND SYMPTOMS  Symptoms can range from mild to severe and can take place over a number of hours. Symptoms may include:  Being unable to:  Raise one or both eyebrows.  Close one or both eyes.  Feel parts of your face (facial numbness).  Drooping of the eyelid and corner of the mouth.  Weakness in the face.  Paralysis of half your face.  Loss of taste.  Sensitivity to loud noises.  Difficulty chewing.  Tearing up of the affected eye.  Dryness in the affected eye.  Drooling.  Pain behind one ear. DIAGNOSIS  Diagnosis of Bell palsy may include:  A medical history and physical exam.  An MRI.  A CT scan.  Electromyography (EMG). This is a test that checks how your nerves are working. TREATMENT  Treatment may include antiviral medicine to help  shorten the length of the condition. Sometimes treatment is not needed and the symptoms go away on their own. HOME CARE INSTRUCTIONS   Take medicines only as directed by your health care provider.  Do facial massages and exercises as directed by your health care provider.  If your eye is affected:  Use moisturizing eye drops to prevent drying of your eye as directed by your health care provider.  Protect your eye as directed by your health care provider. SEEK MEDICAL CARE IF:  Your symptoms do not get better or get worse.  You are drooling.  Your eye is red, irritated, or hurts. SEEK IMMEDIATE MEDICAL CARE IF:   Another part of your body feels weak or numb.  You have difficulty swallowing.  You have a fever along with symptoms of Bell palsy.  You develop neck pain. MAKE SURE YOU:   Understand these instructions.  Will watch your condition.  Will get help right away if you are not doing well or get worse.   This information is not intended to replace advice given to you by your health care provider. Make sure you discuss any questions you have with your health care provider.   Document Released: 09/25/2005 Document Revised: 06/16/2015 Document Reviewed: 01/02/2014 Elsevier Interactive Patient Education 2016 [employer].  [place], Adult Ginette Pitman, also called oral candidiasis, is a fungal infection that develops in the mouth and throat and on the tongue. It causes white patches  to form on the mouth and tongue. Ginette Pitman is most common in older adults, but it can occur at any age.  Many cases of thrush are mild, but this infection can also be more serious. Ginette Pitman can be a recurring problem for people who have chronic illnesses or who take medicines that limit the body's ability to fight infection. Because these people have difficulty fighting infections, the fungus that causes thrush can spread throughout the body. This can cause life-threatening blood or organ infections. CAUSES   Ginette Pitman is usually caused by a yeast called Candida albicans. This fungus is normally present in small amounts in the mouth and on other mucous membranes. It usually causes no harm. However, when conditions are present that allow the fungus to grow uncontrolled, it invades surrounding tissues and becomes an infection. Less often, other Candida species can also lead to thrush.  RISK FACTORS Ginette Pitman is more likely to develop in the following people:  People with an impaired ability to fight infection (weakened immune system).   Older adults.   People with HIV.   People with diabetes.   People with dry mouth (xerostomia).   Pregnant women.   People with poor dental care, especially those who have false teeth.   People who use antibiotic medicines.  SIGNS AND SYMPTOMS  Ginette Pitman can be a mild infection that causes no symptoms. If symptoms develop, they may include:   A burning feeling in the mouth and throat. This can occur at the start of a thrush infection.   White patches that adhere to the mouth and tongue. The tissue around the patches may be red, raw, and painful. If rubbed (during tooth brushing, for example), the patches and the tissue of the mouth may bleed easily.   A bad taste in the mouth or difficulty tasting foods.   Cottony feeling in the mouth.   Pain during eating and swallowing. DIAGNOSIS  Your health care provider can usually diagnose thrush by looking in your mouth and asking you questions about your health.  TREATMENT  Medicines that help prevent the growth of fungi (antifungals) are the standard treatment for thrush. These medicines are either applied directly to the affected area (topical) or swallowed (oral). The treatment will depend on the severity of the condition.  Mild Thrush Mild cases of thrush may clear up with the use of an antifungal mouth rinse or lozenges. Treatment usually lasts about 14 days.  Moderate to Severe Thrush  More severe  thrush infections that have spread to the esophagus are treated with an oral antifungal medicine. A topical antifungal medicine may also be used.   For some severe infections, a treatment period longer than 14 days may be needed.   Oral antifungal medicines are almost never used during pregnancy because the fetus may be harmed. However, if a pregnant woman has a rare, severe thrush infection that has spread to her blood, oral antifungal medicines may be used. In this case, the risk of harm to the mother and fetus from the severe thrush infection may be greater than the risk posed by the use of antifungal medicines.  Persistent or Recurrent Thrush For cases of thrush that do not go away or keep coming back, treatment may involve the following:   Treatment may be needed twice as long as the symptoms last.   Treatment will include both oral and topical antifungal medicines.   People with weakened immune systems can take an antifungal medicine on a continuous basis to prevent thrush  infections.  It is important to treat conditions that make you more likely to get thrush, such as diabetes or HIV.  HOME CARE INSTRUCTIONS   Only take over-the-counter or prescription medicine as directed by your health care provider. Talk to your health care provider about an over-the-counter medicine called gentian violet, which kills bacteria and fungi.   Eat plain, unflavored yogurt as directed by your health care provider. Check the label to make sure the yogurt contains live cultures. This yogurt can help healthy bacteria grow in the mouth that can stop the growth of the fungus that causes thrush.   Try these measures to help reduce the discomfort of thrush:   Drink cold liquids such as water or iced tea.   Try flavored ice treats or frozen juices.   Eat foods that are easy to swallow, such as gelatin, ice cream, or custard.   If the patches in your mouth are painful, try drinking from a straw.    Rinse your mouth several times a day with a warm saltwater rinse. You can make the saltwater mixture with 1 tsp (6 g) of salt in 8 fl oz (0.2 L) of warm water.   If you wear dentures, remove the dentures before going to bed, brush them vigorously, and soak them in a cleaning solution as directed by your health care provider.   Women who are breastfeeding should clean their nipples with an antifungal medicine as directed by their health care provider. Dry the nipples after breastfeeding. Applying lanolin-containing body lotion may help relieve nipple soreness.  SEEK MEDICAL CARE IF:  Your symptoms are getting worse or are not improving within 7 days of starting treatment.   You have symptoms of spreading infection, such as white patches on the skin outside of the mouth.   You are nursing and you have redness, burning, or pain in the nipples that is not relieved with treatment.  MAKE SURE YOU:  Understand these instructions.  Will watch your condition.  Will get help right away if you are not doing well or get worse.   This information is not intended to replace advice given to you by your health care provider. Make sure you discuss any questions you have with your health care provider.   Document Released: 06/20/2004 Document Revised: 10/16/2014 Document Reviewed: 04/28/2013 Elsevier Interactive Patient Education [employer]

## 2015-12-08 NOTE — Progress Notes (Signed)
Subjective:  By signing my name below, I, Stann Ore, attest that this documentation has been prepared under the direction and in the presence of Norberto Sorenson, MD. Electronically Signed: Stann Ore, Scribe. 12/08/2015 , 2:56 PM .  Patient was seen in Room 4 .   Patient ID: Hardin Hardenbrook, male    DOB: 1991/07/10, 25 y.o.   MRN: [id number] Chief Complaint  Patient presents with  . Numbness    left side of pt face is numb.x 2 days   HPI Thang Flett is a 25 y.o. male who presents to [facility] complaining of left sided face numbness with some pain that was noticed 2 days ago. He noticed a bad taste in his mouth and also has a headache. He also noticed that the left side of his face was drooping when he looked in the mirror yesterday. He also mentions food coming out on the left side when he was eating food yesterday. He denies any chronic medical illnesses. He denies any rash, fever, fatigue, or appetite loss.   PCP : Dr. Rogelia Boga, MD He was supposed to see his PCP today, but there weren't any available appointment slots.   Past Medical History  Diagnosis Date  . Obesity   . Depression   . Generalized headaches   . GERD (gastroesophageal reflux disease)    Prior to Admission medications   Medication Sig Start Date End Date Taking? Authorizing Provider  cephALEXin (KEFLEX) 500 MG capsule Take 1 capsule (500 mg total) by mouth 3 (three) times daily. Patient not taking: Reported on 12/08/2015 08/28/15   Tonye Pearson, MD   No Known Allergies  Review of Systems  Constitutional: Negative for fever, chills and fatigue.  Gastrointestinal: Negative for nausea, vomiting, diarrhea and constipation.  Skin: Negative for rash.  Neurological: Positive for facial asymmetry, numbness and headaches. Negative for dizziness, syncope and speech difficulty.      Objective:   Physical Exam  Constitutional: He is oriented to person, place, and time. He appears well-developed and  well-nourished. No distress.  HENT:  Head: Normocephalic and atraumatic.  Some thrush noticed in mouth  Eyes: EOM are normal. Pupils are equal, round, and reactive to light.  Neck: Neck supple.  Cardiovascular: Normal rate.   Pulmonary/Chest: Effort normal. No respiratory distress.  Musculoskeletal: Normal range of motion.  Grip strength and upper extremity strength 5/5  Neurological: He is alert and oriented to person, place, and time.  Skin: Skin is warm and dry.  Psychiatric: He has a normal mood and affect. His behavior is normal.  Nursing note and vitals reviewed.   BP 128/80 mmHg  Pulse 79  Temp(Src) 98.4 F (36.9 C) (Oral)  Resp 18  Ht 5' 9.5" (1.765 m)  Wt 350 lb (158.759 kg)  BMI 50.96 kg/m2  SpO2 97%    Results for orders placed or performed in visit on 12/08/15  Comprehensive metabolic panel  Result Value Ref Range   Sodium 142 135 - 146 mmol/L   Potassium 4.4 3.5 - 5.3 mmol/L   Chloride 103 98 - 110 mmol/L   CO2 29 20 - 31 mmol/L   Glucose, Bld 90 65 - 99 mg/dL   BUN 11 7 - 25 mg/dL   Creat 4.09 8.11 - 9.14 mg/dL   Total Bilirubin 0.5 0.2 - 1.2 mg/dL   Alkaline Phosphatase 75 40 - 115 U/L   AST 20 10 - 40 U/L   ALT 36 9 - 46 U/L   Total Protein  6.8 6.1 - 8.1 g/dL   Albumin 4.3 3.6 - 5.1 g/dL   Calcium 9.0 8.6 - 16.1 mg/dL  TSH  Result Value Ref Range   TSH 1.63 0.40 - 4.50 mIU/L  HIV antibody  Result Value Ref Range   HIV 1&2 Ab, 4th Generation NONREACTIVE NONREACTIVE  POCT CBC  Result Value Ref Range   WBC 8.5 4.6 - 10.2 K/uL   Lymph, poc 2.6 0.6 - 3.4   POC LYMPH PERCENT 30.9 10 - 50 %L   MID (cbc) 0.4 0 - 0.9   POC MID % 4.7 0 - 12 %M   POC Granulocyte 5.5 2 - 6.9   Granulocyte percent 64.4 37 - 80 %G   RBC 5.26 4.69 - 6.13 M/uL   Hemoglobin 15.8 14.1 - 18.1 g/dL   HCT, POC 09.6 04.5 - 53.7 %   MCV 84.2 80 - 97 fL   MCH, POC 30.1 27 - 31.2 pg   MCHC 35.7 (A) 31.8 - 35.4 g/dL   RDW, POC 40.9 %   Platelet Count, POC 248 142 - 424 K/uL    MPV 7.4 0 - 99.8 fL  POCT SEDIMENTATION RATE  Result Value Ref Range   POCT SED RATE 6 0 - 22 mm/hr  POCT glucose (manual entry)  Result Value Ref Range   POC Glucose 89 70 - 99 mg/dl  POCT glycosylated hemoglobin (Hb A1C)  Result Value Ref Range   Hemoglobin A1C 5.1     Assessment & Plan:   1. Left-sided Bell's palsy - valtrex and prednisone (UTD recs 60-80 x 1 wk of latter).  Reviewed eye care.  2. Ginette Pitman - unsure of any provoking condition that could be responsible     Orders Placed This Encounter  Procedures  . Comprehensive metabolic panel  . TSH  . HIV antibody  . POCT CBC  . POCT SEDIMENTATION RATE  . POCT glucose (manual entry)  . POCT glycosylated hemoglobin (Hb A1C)    Meds ordered this encounter  Medications  . valACYclovir (VALTREX) 1000 MG tablet    Sig: Take 1 tablet (1,000 mg total) by mouth 3 (three) times daily.    Dispense:  21 tablet    Refill:  0  . predniSONE (DELTASONE) 20 MG tablet    Sig: 4 tabs po qam x 4d, 3 tabs po qam x 3d, 2 tab po qd x 2d, 1 tab po qd x1d    Dispense:  30 tablet    Refill:  0  . nystatin (MYCOSTATIN) 100000 UNIT/ML suspension    Sig: Take 5 mLs (500,000 Units total) by mouth 4 (four) times daily.    Dispense:  140 mL    Refill:  0    I personally performed the services described in this documentation, which was scribed in my presence. The recorded information has been reviewed and considered, and addended by me as needed.  Norberto Sorenson, MD MPH

## 2015-12-08 NOTE — Telephone Encounter (Signed)
Patient Name: Jermaine Wang  DOB: 09-Aug-1991    Initial Comment Caller states, wants to schedule an appt, 3 days he has pain in Left side face, lip feels like it is drooping, bad taste in mouth,    Nurse Assessment  Nurse: Sherilyn Cooter, RN, Thurmond Butts Date/Time (Eastern Time): 12/08/2015 11:04:28 AM  Confirm and document reason for call. If symptomatic, describe symptoms. You must click the next button to save text entered. ---Caller states that he has pain in the left side of his face which has been building up for the last 3 days. Today, it is at its worst. He rates his pain as 5 on 0-10 scale. The left side of his lower lip feels like it is drooping. Smile is not symmetrical. He denies numbness in his mouth or tongue. The left side of his mouth will not open all the way. He has a bitter taste in his mouth.  Has the patient traveled out of the country within the last 30 days? ---No  Does the patient have any new or worsening symptoms? ---Yes  Will a triage be completed? ---Yes  Related visit to physician within the last 2 weeks? ---No  Does the PT have any chronic conditions? (i.e. diabetes, asthma, etc.) ---No  Is this a behavioral health or substance abuse call? ---No     Guidelines    Guideline Title Affirmed Question Affirmed Notes  Neurologic Deficit Bell's palsy suspected (i.e., weakness on only one side of the face, developing over hours to days, no other symptoms)    Final Disposition User   Go to ED Now (or PCP triage) Sherilyn Cooter, RN, Thurmond Butts    Comments  charge nurse says to use [facility]  No appointments available at [facility] or [facility].   Referrals  REFERRED TO PCP OFFICE  [facility]   Disagree/Comply: Comply

## 2015-12-09 LAB — TSH: TSH: 1.63 mIU/L (ref 0.40–4.50)

## 2015-12-09 NOTE — Telephone Encounter (Signed)
Pt went to [facility] Care on 3/1.

## 2017-03-12 ENCOUNTER — Encounter (HOSPITAL_COMMUNITY): Payer: Self-pay

## 2017-03-12 ENCOUNTER — Emergency Department (HOSPITAL_COMMUNITY): Payer: BLUE CROSS/BLUE SHIELD

## 2017-03-12 DIAGNOSIS — R1011 Right upper quadrant pain: Secondary | ICD-10-CM | POA: Diagnosis present

## 2017-03-12 DIAGNOSIS — K8010 Calculus of gallbladder with chronic cholecystitis without obstruction: Secondary | ICD-10-CM | POA: Diagnosis not present

## 2017-03-12 DIAGNOSIS — Z6841 Body Mass Index (BMI) 40.0 and over, adult: Secondary | ICD-10-CM | POA: Insufficient documentation

## 2017-03-12 DIAGNOSIS — K819 Cholecystitis, unspecified: Secondary | ICD-10-CM | POA: Diagnosis present

## 2017-03-12 DIAGNOSIS — F329 Major depressive disorder, single episode, unspecified: Secondary | ICD-10-CM | POA: Diagnosis not present

## 2017-03-12 DIAGNOSIS — K8000 Calculus of gallbladder with acute cholecystitis without obstruction: Secondary | ICD-10-CM | POA: Diagnosis not present

## 2017-03-12 DIAGNOSIS — K219 Gastro-esophageal reflux disease without esophagitis: Secondary | ICD-10-CM | POA: Insufficient documentation

## 2017-03-12 LAB — COMPREHENSIVE METABOLIC PANEL
ALBUMIN: 4.1 g/dL (ref 3.5–5.0)
ALT: 165 U/L — ABNORMAL HIGH (ref 17–63)
AST: 270 U/L — AB (ref 15–41)
Alkaline Phosphatase: 83 U/L (ref 38–126)
Anion gap: 9 (ref 5–15)
BUN: 8 mg/dL (ref 6–20)
CO2: 27 mmol/L (ref 22–32)
Calcium: 9.2 mg/dL (ref 8.9–10.3)
Chloride: 105 mmol/L (ref 101–111)
Creatinine, Ser: 1.01 mg/dL (ref 0.61–1.24)
GFR calc Af Amer: >60 mL/min (ref >60)
GFR calc non Af Amer: >60 mL/min (ref >60)
GLUCOSE: 114 mg/dL — AB (ref 65–99)
POTASSIUM: 4.3 mmol/L (ref 3.5–5.1)
Sodium: 141 mmol/L (ref 135–145)
TOTAL PROTEIN: 6.5 g/dL (ref 6.5–8.1)
Total Bilirubin: 1 mg/dL (ref 0.3–1.2)

## 2017-03-12 LAB — CBC
HEMATOCRIT: 44.7 % (ref 39.0–52.0)
HEMOGLOBIN: 15.1 g/dL (ref 13.0–17.0)
MCH: 29 pg (ref 26.0–34.0)
MCHC: 33.8 g/dL (ref 30.0–36.0)
MCV: 85.8 fL (ref 78.0–100.0)
Platelets: 199 10*3/uL (ref 150–400)
RBC: 5.21 MIL/uL (ref 4.22–5.81)
RDW: 13.2 % (ref 11.5–15.5)
WBC: 8 10*3/uL (ref 4.0–10.5)

## 2017-03-12 LAB — URINALYSIS, ROUTINE W REFLEX MICROSCOPIC
Bilirubin Urine: NEGATIVE
GLUCOSE, UA: NEGATIVE mg/dL
Hgb urine dipstick: NEGATIVE
KETONES UR: NEGATIVE mg/dL
LEUKOCYTES UA: NEGATIVE
NITRITE: NEGATIVE
PH: 6 (ref 5.0–8.0)
Protein, ur: NEGATIVE mg/dL
SPECIFIC GRAVITY, URINE: 1.021 (ref 1.005–1.030)

## 2017-03-12 LAB — LIPASE, BLOOD: LIPASE: 22 U/L (ref 11–51)

## 2017-03-12 NOTE — ED Triage Notes (Signed)
Epigastric pain x 2 days that radiates to right. PT states pain is worse after eating. PT admits to nausea and diarrhea. No vomiting.

## 2017-03-13 ENCOUNTER — Emergency Department (HOSPITAL_COMMUNITY): Payer: BLUE CROSS/BLUE SHIELD

## 2017-03-13 ENCOUNTER — Observation Stay (HOSPITAL_COMMUNITY): Payer: BLUE CROSS/BLUE SHIELD | Admitting: Certified Registered Nurse Anesthetist

## 2017-03-13 ENCOUNTER — Observation Stay (HOSPITAL_COMMUNITY): Payer: BLUE CROSS/BLUE SHIELD

## 2017-03-13 ENCOUNTER — Encounter (HOSPITAL_COMMUNITY)
Admission: EM | Disposition: A | Discharge status: Home / Self Care | Payer: Self-pay | Source: Home / Self Care | Attending: Emergency Medicine

## 2017-03-13 ENCOUNTER — Encounter (HOSPITAL_COMMUNITY): Payer: Self-pay | Admitting: *Deleted

## 2017-03-13 ENCOUNTER — Observation Stay (HOSPITAL_COMMUNITY)
Admission: EM | Admit: 2017-03-13 | Discharge: 2017-03-14 | Disposition: A | Discharge status: Home / Self Care | Payer: BLUE CROSS/BLUE SHIELD | Attending: General Surgery | Admitting: General Surgery

## 2017-03-13 DIAGNOSIS — R1011 Right upper quadrant pain: Secondary | ICD-10-CM

## 2017-03-13 DIAGNOSIS — K819 Cholecystitis, unspecified: Secondary | ICD-10-CM

## 2017-03-13 DIAGNOSIS — K810 Acute cholecystitis: Secondary | ICD-10-CM | POA: Diagnosis present

## 2017-03-13 HISTORY — PX: CHOLECYSTECTOMY: SHX55

## 2017-03-13 LAB — COMPREHENSIVE METABOLIC PANEL
ALT: 219 U/L — ABNORMAL HIGH (ref 17–63)
AST: 204 U/L — AB (ref 15–41)
Albumin: 4 g/dL (ref 3.5–5.0)
Alkaline Phosphatase: 85 U/L (ref 38–126)
Anion gap: 9 (ref 5–15)
BUN: 6 mg/dL (ref 6–20)
CHLORIDE: 100 mmol/L — AB (ref 101–111)
CO2: 24 mmol/L (ref 22–32)
Calcium: 8.5 mg/dL — ABNORMAL LOW (ref 8.9–10.3)
Creatinine, Ser: 0.92 mg/dL (ref 0.61–1.24)
Glucose, Bld: 120 mg/dL — ABNORMAL HIGH (ref 65–99)
POTASSIUM: 3.6 mmol/L (ref 3.5–5.1)
SODIUM: 133 mmol/L — AB (ref 135–145)
Total Bilirubin: 0.4 mg/dL (ref 0.3–1.2)
Total Protein: 6.5 g/dL (ref 6.5–8.1)

## 2017-03-13 LAB — SURGICAL PCR SCREEN
MRSA, PCR: NEGATIVE
Staphylococcus aureus: NEGATIVE

## 2017-03-13 LAB — CBC
HEMATOCRIT: 44.5 % (ref 39.0–52.0)
Hemoglobin: 14.8 g/dL (ref 13.0–17.0)
MCH: 28.6 pg (ref 26.0–34.0)
MCHC: 33.3 g/dL (ref 30.0–36.0)
MCV: 85.9 fL (ref 78.0–100.0)
Platelets: 205 10*3/uL (ref 150–400)
RBC: 5.18 MIL/uL (ref 4.22–5.81)
RDW: 13.3 % (ref 11.5–15.5)
WBC: 8.3 10*3/uL (ref 4.0–10.5)

## 2017-03-13 LAB — PROTIME-INR
INR: 1
PROTHROMBIN TIME: 13.2 s (ref 11.4–15.2)

## 2017-03-13 LAB — HIV ANTIBODY (ROUTINE TESTING W REFLEX): HIV SCREEN 4TH GENERATION: NONREACTIVE

## 2017-03-13 SURGERY — LAPAROSCOPIC CHOLECYSTECTOMY WITH INTRAOPERATIVE CHOLANGIOGRAM
Anesthesia: General

## 2017-03-13 MED ORDER — HYDROMORPHONE HCL 1 MG/ML IJ SOLN
INTRAMUSCULAR | Status: AC
  Administered 2017-03-13: 0.25 mg via INTRAVENOUS
  Filled 2017-03-13: qty 0.5

## 2017-03-13 MED ORDER — SODIUM CHLORIDE 0.9 % IV SOLN
INTRAVENOUS | Status: DC
  Administered 2017-03-13: 22:00:00 via INTRAVENOUS

## 2017-03-13 MED ORDER — DIPHENHYDRAMINE HCL 50 MG/ML IJ SOLN
12.5000 mg | Freq: Four times a day (QID) | INTRAMUSCULAR | Status: DC | PRN
Start: 2017-03-13 — End: 2017-03-14

## 2017-03-13 MED ORDER — OXYCODONE HCL 5 MG PO TABS
5.0000 mg | ORAL_TABLET | ORAL | Status: DC | PRN
  Administered 2017-03-13: 10 mg via ORAL
  Filled 2017-03-13: qty 2

## 2017-03-13 MED ORDER — HYDROMORPHONE HCL 1 MG/ML IJ SOLN: 0.5000 mg | INTRAMUSCULAR | Status: DC | PRN

## 2017-03-13 MED ORDER — ACETAMINOPHEN 325 MG PO TABS: 650.0000 mg | ORAL_TABLET | Freq: Four times a day (QID) | ORAL | Status: DC | PRN

## 2017-03-13 MED ORDER — SUGAMMADEX SODIUM 200 MG/2ML IV SOLN
INTRAVENOUS | Status: AC
  Filled 2017-03-13: qty 2

## 2017-03-13 MED ORDER — IBUPROFEN 600 MG PO TABS: 600.0000 mg | ORAL_TABLET | Freq: Four times a day (QID) | ORAL | Status: DC | PRN

## 2017-03-13 MED ORDER — MIDAZOLAM HCL 5 MG/5ML IJ SOLN
INTRAMUSCULAR | Status: DC | PRN
  Administered 2017-03-13: 2 mg via INTRAVENOUS

## 2017-03-13 MED ORDER — SODIUM CHLORIDE 0.9 % IV SOLN
Freq: Once | INTRAVENOUS | Status: AC
  Administered 2017-03-13: 01:00:00 via INTRAVENOUS

## 2017-03-13 MED ORDER — PROMETHAZINE HCL 25 MG/ML IJ SOLN: 6.2500 mg | INTRAMUSCULAR | Status: DC | PRN

## 2017-03-13 MED ORDER — BUPIVACAINE-EPINEPHRINE (PF) 0.25% -1:200000 IJ SOLN
INTRAMUSCULAR | Status: AC
  Filled 2017-03-13: qty 30

## 2017-03-13 MED ORDER — DEXTROSE 5 % IV SOLN
2.0000 g | INTRAVENOUS | Status: DC
  Administered 2017-03-13: 2 g via INTRAVENOUS
  Filled 2017-03-13: qty 2

## 2017-03-13 MED ORDER — MEPERIDINE HCL 25 MG/ML IJ SOLN: 6.2500 mg | INTRAMUSCULAR | Status: DC | PRN

## 2017-03-13 MED ORDER — SUCCINYLCHOLINE CHLORIDE 200 MG/10ML IV SOSY
PREFILLED_SYRINGE | INTRAVENOUS | Status: AC
  Filled 2017-03-13: qty 10

## 2017-03-13 MED ORDER — DOCUSATE SODIUM 100 MG PO CAPS
100.0000 mg | ORAL_CAPSULE | Freq: Two times a day (BID) | ORAL | Status: DC
  Administered 2017-03-13 – 2017-03-14 (×2): 100 mg via ORAL
  Filled 2017-03-13 (×2): qty 1

## 2017-03-13 MED ORDER — ACETAMINOPHEN 650 MG RE SUPP: 650.0000 mg | Freq: Four times a day (QID) | RECTAL | Status: DC | PRN

## 2017-03-13 MED ORDER — ENOXAPARIN SODIUM 40 MG/0.4ML ~~LOC~~ SOLN: 40.0000 mg | SUBCUTANEOUS | Status: DC

## 2017-03-13 MED ORDER — ENOXAPARIN SODIUM 60 MG/0.6ML ~~LOC~~ SOLN: 60.0000 mg | SUBCUTANEOUS | Status: DC

## 2017-03-13 MED ORDER — ACETAMINOPHEN 10 MG/ML IV SOLN
INTRAVENOUS | Status: AC
  Administered 2017-03-13: 1000 mg via INTRAVENOUS
  Filled 2017-03-13: qty 100

## 2017-03-13 MED ORDER — LIDOCAINE HCL (CARDIAC) 20 MG/ML IV SOLN
INTRAVENOUS | Status: DC | PRN
  Administered 2017-03-13: 100 mg via INTRAVENOUS

## 2017-03-13 MED ORDER — LIDOCAINE 2% (20 MG/ML) 5 ML SYRINGE
INTRAMUSCULAR | Status: AC
  Filled 2017-03-13: qty 5

## 2017-03-13 MED ORDER — SUCCINYLCHOLINE CHLORIDE 200 MG/10ML IV SOSY
PREFILLED_SYRINGE | INTRAVENOUS | Status: DC | PRN
  Administered 2017-03-13: 140 mg via INTRAVENOUS

## 2017-03-13 MED ORDER — HYDROMORPHONE HCL 1 MG/ML IJ SOLN
0.2500 mg | INTRAMUSCULAR | Status: DC | PRN
  Administered 2017-03-13 (×2): 0.25 mg via INTRAVENOUS

## 2017-03-13 MED ORDER — ROCURONIUM BROMIDE 100 MG/10ML IV SOLN
INTRAVENOUS | Status: DC | PRN
  Administered 2017-03-13: 10 mg via INTRAVENOUS
  Administered 2017-03-13: 20 mg via INTRAVENOUS
  Administered 2017-03-13: 40 mg via INTRAVENOUS

## 2017-03-13 MED ORDER — ONDANSETRON HCL 4 MG/2ML IJ SOLN: 4.0000 mg | Freq: Four times a day (QID) | INTRAMUSCULAR | Status: DC | PRN

## 2017-03-13 MED ORDER — DEXAMETHASONE SODIUM PHOSPHATE 10 MG/ML IJ SOLN
INTRAMUSCULAR | Status: AC
  Filled 2017-03-13: qty 1

## 2017-03-13 MED ORDER — SUGAMMADEX SODIUM 500 MG/5ML IV SOLN
INTRAVENOUS | Status: DC | PRN
  Administered 2017-03-13: 400 mg via INTRAVENOUS

## 2017-03-13 MED ORDER — ONDANSETRON 4 MG PO TBDP: 4.0000 mg | ORAL_TABLET | Freq: Four times a day (QID) | ORAL | Status: DC | PRN

## 2017-03-13 MED ORDER — PANTOPRAZOLE SODIUM 40 MG IV SOLR
40.0000 mg | Freq: Every day | INTRAVENOUS | Status: DC
  Administered 2017-03-13 (×2): 40 mg via INTRAVENOUS
  Filled 2017-03-13 (×2): qty 40

## 2017-03-13 MED ORDER — ONDANSETRON HCL 4 MG/2ML IJ SOLN
INTRAMUSCULAR | Status: DC | PRN
  Administered 2017-03-13: 4 mg via INTRAVENOUS

## 2017-03-13 MED ORDER — ONDANSETRON HCL 4 MG/2ML IJ SOLN
INTRAMUSCULAR | Status: AC
  Filled 2017-03-13: qty 2

## 2017-03-13 MED ORDER — LACTATED RINGERS IV SOLN
INTRAVENOUS | Status: DC
  Administered 2017-03-13: 13:00:00 via INTRAVENOUS

## 2017-03-13 MED ORDER — DEXAMETHASONE SODIUM PHOSPHATE 10 MG/ML IJ SOLN
INTRAMUSCULAR | Status: DC | PRN
  Administered 2017-03-13: 10 mg via INTRAVENOUS

## 2017-03-13 MED ORDER — SODIUM CHLORIDE 0.9 % IV SOLN
INTRAVENOUS | Status: DC
  Administered 2017-03-13 (×2): via INTRAVENOUS

## 2017-03-13 MED ORDER — HYDROMORPHONE HCL 1 MG/ML IJ SOLN
1.0000 mg | INTRAMUSCULAR | Status: DC | PRN
  Administered 2017-03-13: 1 mg via INTRAVENOUS
  Filled 2017-03-13: qty 1

## 2017-03-13 MED ORDER — FENTANYL CITRATE (PF) 250 MCG/5ML IJ SOLN
INTRAMUSCULAR | Status: AC
  Filled 2017-03-13: qty 5

## 2017-03-13 MED ORDER — MIDAZOLAM HCL 2 MG/2ML IJ SOLN
INTRAMUSCULAR | Status: AC
  Filled 2017-03-13: qty 2

## 2017-03-13 MED ORDER — HYDRALAZINE HCL 20 MG/ML IJ SOLN: 10.0000 mg | INTRAMUSCULAR | Status: DC | PRN

## 2017-03-13 MED ORDER — BUPIVACAINE-EPINEPHRINE 0.25% -1:200000 IJ SOLN
INTRAMUSCULAR | Status: DC | PRN
  Administered 2017-03-13: 30 mL

## 2017-03-13 MED ORDER — SODIUM CHLORIDE 0.9 % IV SOLN
INTRAVENOUS | Status: DC | PRN
  Administered 2017-03-13: 100 mL

## 2017-03-13 MED ORDER — PROPOFOL 10 MG/ML IV BOLUS
INTRAVENOUS | Status: DC | PRN
Start: 2017-03-13 — End: 2017-03-13
  Administered 2017-03-13: 300 mg via INTRAVENOUS

## 2017-03-13 MED ORDER — SODIUM CHLORIDE 0.9 % IR SOLN
Status: DC | PRN
  Administered 2017-03-13: 1000 mL

## 2017-03-13 MED ORDER — FENTANYL CITRATE (PF) 100 MCG/2ML IJ SOLN
INTRAMUSCULAR | Status: DC | PRN
  Administered 2017-03-13: 100 ug via INTRAVENOUS
  Administered 2017-03-13: 25 ug via INTRAVENOUS
  Administered 2017-03-13: 50 ug via INTRAVENOUS

## 2017-03-13 MED ORDER — ROCURONIUM BROMIDE 10 MG/ML (PF) SYRINGE
PREFILLED_SYRINGE | INTRAVENOUS | Status: AC
  Filled 2017-03-13: qty 5

## 2017-03-13 MED ORDER — ACETAMINOPHEN 10 MG/ML IV SOLN
1000.0000 mg | Freq: Once | INTRAVENOUS | Status: AC
  Administered 2017-03-13: 1000 mg via INTRAVENOUS

## 2017-03-13 MED ORDER — 0.9 % SODIUM CHLORIDE (POUR BTL) OPTIME
TOPICAL | Status: DC | PRN
  Administered 2017-03-13: 1000 mL

## 2017-03-13 MED ORDER — IOPAMIDOL (ISOVUE-300) INJECTION 61%
INTRAVENOUS | Status: AC
  Filled 2017-03-13: qty 50

## 2017-03-13 MED ORDER — DIPHENHYDRAMINE HCL 12.5 MG/5ML PO ELIX: 12.5000 mg | ORAL_SOLUTION | Freq: Four times a day (QID) | ORAL | Status: DC | PRN

## 2017-03-13 MED ORDER — PROPOFOL 10 MG/ML IV BOLUS
INTRAVENOUS | Status: AC
  Filled 2017-03-13: qty 20

## 2017-03-13 SURGICAL SUPPLY — 47 items
ADH SKN CLS APL DERMABOND .7 (GAUZE/BANDAGES/DRESSINGS) ×1
APPLIER CLIP 5 13 M/L LIGAMAX5 (MISCELLANEOUS) ×3
APR CLP MED LRG 5 ANG JAW (MISCELLANEOUS) ×1
BAG SPEC RTRVL 10 TROC 200 (ENDOMECHANICALS) ×1
BLADE CLIPPER SURG (BLADE) IMPLANT
CANISTER SUCT 3000ML PPV (MISCELLANEOUS) ×3 IMPLANT
CHLORAPREP W/TINT 26ML (MISCELLANEOUS) ×3 IMPLANT
CLIP APPLIE 5 13 M/L LIGAMAX5 (MISCELLANEOUS) ×1 IMPLANT
CLOSURE WOUND 1/2 X4 (GAUZE/BANDAGES/DRESSINGS) ×1
COVER MAYO STAND STRL (DRAPES) IMPLANT
COVER SURGICAL LIGHT HANDLE (MISCELLANEOUS) ×3 IMPLANT
DERMABOND ADVANCED (GAUZE/BANDAGES/DRESSINGS) ×2
DERMABOND ADVANCED .7 DNX12 (GAUZE/BANDAGES/DRESSINGS) ×1 IMPLANT
DRAPE C-ARM 42X72 X-RAY (DRAPES) IMPLANT
DRSG TEGADERM 2-3/8X2-3/4 SM (GAUZE/BANDAGES/DRESSINGS) ×6 IMPLANT
ELECT REM PT RETURN 9FT ADLT (ELECTROSURGICAL) ×3
ELECTRODE REM PT RTRN 9FT ADLT (ELECTROSURGICAL) ×1 IMPLANT
GLOVE BIO SURGEON STRL SZ7 (GLOVE) ×2 IMPLANT
GLOVE BIO SURGEON STRL SZ8 (GLOVE) ×2 IMPLANT
GLOVE BIOGEL PI IND STRL 8 (GLOVE) ×1 IMPLANT
GLOVE BIOGEL PI INDICATOR 8 (GLOVE) ×4
GLOVE ECLIPSE 7.5 STRL STRAW (GLOVE) ×5 IMPLANT
GLOVE SURG SS PI 8.0 STRL IVOR (GLOVE) ×4 IMPLANT
GOWN STRL REUS W/ TWL LRG LVL3 (GOWN DISPOSABLE) ×3 IMPLANT
GOWN STRL REUS W/TWL LRG LVL3 (GOWN DISPOSABLE) ×9
KIT BASIN OR (CUSTOM PROCEDURE TRAY) ×3 IMPLANT
KIT ROOM TURNOVER OR (KITS) ×3 IMPLANT
L-HOOK LAP DISP 36CM (ELECTROSURGICAL) ×3
LHOOK LAP DISP 36CM (ELECTROSURGICAL) ×1 IMPLANT
NS IRRIG 1000ML POUR BTL (IV SOLUTION) ×3 IMPLANT
PAD ARMBOARD 7.5X6 YLW CONV (MISCELLANEOUS) ×3 IMPLANT
PENCIL BUTTON HOLSTER BLD 10FT (ELECTRODE) ×3 IMPLANT
POUCH RETRIEVAL ECOSAC 10 (ENDOMECHANICALS) ×1 IMPLANT
POUCH RETRIEVAL ECOSAC 10MM (ENDOMECHANICALS) ×2
SCISSORS LAP 5X35 DISP (ENDOMECHANICALS) ×3 IMPLANT
SET CHOLANGIOGRAPH 5 50 .035 (SET/KITS/TRAYS/PACK) IMPLANT
SET IRRIG TUBING LAPAROSCOPIC (IRRIGATION / IRRIGATOR) ×3 IMPLANT
SLEEVE ENDOPATH XCEL 5M (ENDOMECHANICALS) ×6 IMPLANT
SPECIMEN JAR SMALL (MISCELLANEOUS) ×3 IMPLANT
STRIP CLOSURE SKIN 1/2X4 (GAUZE/BANDAGES/DRESSINGS) ×2 IMPLANT
SUT MNCRL AB 4-0 PS2 18 (SUTURE) ×3 IMPLANT
TOWEL OR 17X24 6PK STRL BLUE (TOWEL DISPOSABLE) ×3 IMPLANT
TOWEL OR 17X26 10 PK STRL BLUE (TOWEL DISPOSABLE) ×3 IMPLANT
TRAY LAPAROSCOPIC MC (CUSTOM PROCEDURE TRAY) ×3 IMPLANT
TROCAR XCEL BLUNT TIP 100MML (ENDOMECHANICALS) ×3 IMPLANT
TROCAR XCEL NON-BLD 5MMX100MML (ENDOMECHANICALS) ×3 IMPLANT
TUBING INSUFFLATION (TUBING) ×3 IMPLANT

## 2017-03-13 NOTE — Anesthesia Preprocedure Evaluation (Signed)
Anesthesia Evaluation  Patient identified by MRN, date of birth, ID band Patient awake    Reviewed: Allergy & Precautions, NPO status , Patient's Chart, lab work & pertinent test results  Airway Mallampati: I  TM Distance: >3 FB Neck ROM: Full    Dental no notable dental hx. (+) Teeth Intact   Pulmonary neg pulmonary ROS,    Pulmonary exam normal breath sounds clear to auscultation       Cardiovascular negative cardio ROS Normal cardiovascular exam Rhythm:Regular Rate:Normal     Neuro/Psych  Headaches, PSYCHIATRIC DISORDERS Depression    GI/Hepatic Neg liver ROS, GERD  Medicated and Controlled,Cholelithiasis   Endo/Other  Morbid obesity  Renal/GU negative Renal ROS  negative genitourinary   Musculoskeletal negative musculoskeletal ROS (+)   Abdominal (+) + obese,   Peds  Hematology negative hematology ROS (+)   Anesthesia Other Findings   Reproductive/Obstetrics                             Anesthesia Physical Anesthesia Plan  ASA: [name]  Anesthesia Plan: General   Post-op Pain Management:    Induction: Intravenous, Rapid sequence and Cricoid pressure planned  PONV Risk Score and Plan: 4 or greater and Ondansetron, Dexamethasone, Propofol, Midazolam, Scopolamine patch - Pre-op and Treatment may vary due to age  Airway Management Planned: Oral ETT  Additional Equipment:   Intra-op Plan:   Post-operative Plan: Extubation in OR  Informed Consent: I have reviewed the patients History and Physical, chart, labs and discussed the procedure including the risks, benefits and alternatives for the proposed anesthesia with the patient or authorized representative who has indicated his/her understanding and acceptance.   Dental advisory given  Plan Discussed with: CRNA, Anesthesiologist and Surgeon  Anesthesia Plan Comments:         Anesthesia Quick Evaluation

## 2017-03-13 NOTE — Anesthesia Procedure Notes (Signed)
Procedure Name: Intubation Date/Time: 03/13/2017 1:56 PM Performed by: Jed LimerickHARDER, Jermaine Wang Pre-anesthesia Checklist: Patient identified, Emergency Drugs available, Suction available and Patient being monitored Patient Re-evaluated:Patient Re-evaluated prior to inductionOxygen Delivery Method: Circle System Utilized Preoxygenation: Pre-oxygenation with 100% oxygen Intubation Type: Inhalational induction and Inhalational induction with existing ETT Tube type: Oral Tube size: 7.5 mm Number of attempts: 1 Airway Equipment and Method: Bougie stylet Placement Confirmation: positive ETCO2 and breath sounds checked- equal and bilateral Secured at: 21 cm Tube secured with: Tape Dental Injury: Teeth and Oropharynx as per pre-operative assessment  Comments: By Sharen CounterJ Solheim CRNA

## 2017-03-13 NOTE — ED Notes (Signed)
Pt at [facility]

## 2017-03-13 NOTE — Interval H&P Note (Signed)
History and Physical Interval Note:  Explained the procedure to the patient who understands and wishes to proceed.    concent signed and on the chart.  03/13/2017 7:29 AM  Jermaine RutherfordJesse Wang  has presented today for surgery, with the diagnosis of Symptomatic cholelithiasis  The various methods of treatment have been discussed with the patient and family. After consideration of risks, benefits and other options for treatment, the patient has consented to  Procedure(s): LAPAROSCOPIC CHOLECYSTECTOMY WITH INTRAOPERATIVE CHOLANGIOGRAM (N/A) as a surgical intervention .  The patient's history has been reviewed, patient examined, no change in status, stable for surgery.  I have reviewed the patient's chart and labs.  Questions were answered to the patient's satisfaction.     Danuta Huseman

## 2017-03-13 NOTE — Anesthesia Procedure Notes (Signed)
Procedure Name: Intubation Date/Time: 03/13/2017 1:47 PM Performed by: Candis Shine Pre-anesthesia Checklist: Patient identified, Emergency Drugs available, Suction available and Patient being monitored Patient Re-evaluated:Patient Re-evaluated prior to inductionOxygen Delivery Method: Circle System Utilized Preoxygenation: Pre-oxygenation with 100% oxygen Intubation Type: IV induction, Cricoid Pressure applied and Rapid sequence Laryngoscope Size: Mac and 3 Grade View: Grade I Tube type: Oral Tube size: 7.5 mm Number of attempts: 1 Airway Equipment and Method: Stylet Placement Confirmation: ETT inserted through vocal cords under direct vision,  positive ETCO2 and breath sounds checked- equal and bilateral Secured at: 22 cm Tube secured with: Tape Dental Injury: Teeth and Oropharynx as per pre-operative assessment

## 2017-03-13 NOTE — Transfer of Care (Signed)
Immediate Anesthesia Transfer of Care Note  Patient: Jermaine RutherfordJesse Moulton  Procedure(s) Performed: Procedure(s): LAPAROSCOPIC CHOLECYSTECTOMY WITH INTRAOPERATIVE CHOLANGIOGRAM (N/A)  Patient Location: PACU  Anesthesia Type:General  Level of Consciousness: awake, alert  and oriented  Airway & Oxygen Therapy: Patient Spontanous Breathing and Patient connected to nasal cannula oxygen  Post-op Assessment: Report given to RN and Post -op Vital signs reviewed and stable  Post vital signs: Reviewed and stable  Last Vitals:  Vitals:   03/13/17 0633 03/13/17 1116  BP: 119/64 138/79  Pulse: 63 67  Resp: 18 18  Temp: 36.9 C 36.6 C    Last Pain:  Vitals:   03/13/17 1116  TempSrc: Oral  PainSc:          Complications: No apparent anesthesia complications

## 2017-03-13 NOTE — ED Notes (Signed)
Patient did not answer for vital signs re-check.

## 2017-03-13 NOTE — Op Note (Addendum)
OPERATIVE REPORT  DATE OF OPERATION: 03/13/2017  PATIENT:  Jermaine Wang  26 y.o. male  PRE-OPERATIVE DIAGNOSIS:  Symptomatic cholelithiasis  POST-OPERATIVE DIAGNOSIS:  Symptomatic cholelithiasis  INDICATION(S) FOR OPERATION:  Acute cholecystitis and cholelithiasis  FINDINGS:  Large impacted stones in the neck of the gallbladder  PROCEDURE:  Procedure(s): LAPAROSCOPIC CHOLECYSTECTOMY WITH INTRAOPERATIVE CHOLANGIOGRAM  SURGEON:  Surgeon(s): Jimmye Norman, MD Violeta Gelinas, MD  ASSISTANT: Janee Morn, MD  ANESTHESIA:   general  COMPLICATIONS:  None  EBL: 20 ml  BLOOD ADMINISTERED: none  DRAINS: none   SPECIMEN:  Source of Specimen:  Gallbladder and contents  COUNTS CORRECT:  YES  PROCEDURE DETAILS: The patient was taken to the operating room and placed on the table in the supine position.  After an adequate endotracheal anesthetic was administered, the patient was prepped with ChloroPrep, and then draped in the usual manner exposing the entire abdomen laterally, inferiorly and up  to the costal margins.  After a proper timeout was performed including identifying the patient and the procedure to be performed, a supraumbilical 1.5cm midline incision was made using a #15 blade.  This was taken down to the fascia which was then incised with a #15 blade.  The edges of the fascia were tented up with Kocher clamps as the preperitoneal space was penetrated with a Kelly clamp into the peritoneum.  Once this was done, a pursestring suture of 0 Vicryl was passed around the fascial opening.  This was subsequently used to secure the [facility] cannula which was passed into the peritoneal cavity.  Once the [facility] cannula was in place, carbon dioxide gas was insufflated into the peritoneal cavity up to a maximal intra-abdominal pressure of 15mm Hg.The laparoscope, with attached camera and light source, was passed into the peritoneal cavity to visualize the direct insertion of two right upper quadrant  5mm cannulas, and a sup-xiphoid 5mm cannula.  Once all cannulas were in place, the dissection was begun.  Two ratcheted graspers were attached to the dome and infundibulum of the gallbladder and retracted towards the anterior abdominal wall and the right upper quadrant.  Using cautery attached to a dissecting forceps, the peritoneum overlaying the triangle of Chalot and the hepatoduodenal triangle was dissected away exposing the cystic duct and the cystic artery.  A critical window was developed between the CBD and the cystic duct The cystic artery was clipped proximally and distally then transected.  A clip was placed on the gallbladder side of the cystic duct, then a cholecystodochotomy made using the laparoscopic scissors.  Through the cholecystodochotomy a Cook catheter was passed to performed a cholangiogram.  The cholangiogram showed goo proximal filling, no intraductal filling defects, good flow into the duodenum, no ductal dilatation..  Once the cholangiogram was completed, the [facility] catheter was removed, and the distal cystic duct was clipped multiple times then transected between the clips.  The gallbladder was then dissected out of the hepatic bed without event.  It was retrieved from the abdomen using an EcoPouch bag without event.  Once the gallbladder was removed, the bed was inspected for hemostasis.  Once excellent hemostasis was obtained all gas and fluids were aspirated from above the liver, then the cannulas were removed.  The supraumbilical incision was closed using the pursestring suture which was in place.  0.25% bupivicaine with epinephrine was injected at all sites.  All 10mm or greater cannula sites were close using a running subcuticular stitch of 4-0 Monocryl.  5.47mm cannula sites were closed with Dermabond only.Steri-Strips and  Tagaderm were used to complete the dressings at all sites.  At this point all needle, sponge, and instrument counts were correct.The patient was awakened  from anesthesia and taken to the PACU in stable condition.  Marta LamasJames O. Gae BonWyatt, [name], MD, FACS [phone]--pager [phone]--office [facility]  PATIENT DISPOSITION:  PACU - hemodynamically stable.        Jermaine Wang 6/5/20183:31 PM

## 2017-03-13 NOTE — H&P (Signed)
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 The pain is in the high epigastrium and radiates to his back and across his right upper abdomen. He describes it as intense pressure and almost sharp pain which is associated with nausea. He has not vomited. Denies fever or jaundice. Denies constipation but has occasional diarrhea depending on what he eats.  Has not tried anything to relieve the pain as he really dislikes taking any medications- in fact even here in the ER he has not requested pain meds as the pain is becoming bearable the longer it goes since he last ate. He has had a similar, although more brief bout of similar symptoms one or 2 months ago.  He eats a lot of fast food due to the convenience with his work. He does not smoke, drink or use any drugs. Lives at home with his aunt, parents and sister.  He does not endorse any medical problems but admits that he does not go to the doctor unless he absolutely has to.   No Known Allergies  Past Medical History:  Diagnosis Date  . Depression   . Generalized headaches   . GERD (gastroesophageal reflux disease)   . Obesity     History reviewed. No pertinent surgical history.  Family History  Problem Relation Age of Onset  . Hypertension Mother   . Diabetes Mother   . Heart disease Father   . Hypertension Father   . Cancer Paternal Grandmother   . Stroke Paternal Grandmother     Social History   Social History  . Marital status: Single    Spouse name: N/A  . Number of children: N/A  . Years of education: N/A   Social History Main Topics  . Smoking status: Never Smoker  . Smokeless tobacco: Never Used  . Alcohol use No  . Drug use: No  . Sexual activity: Not Asked   Other Topics Concern  . None   Social History Narrative  . None    No current  facility-administered medications on file prior to encounter.    Current Outpatient Prescriptions on File Prior to Encounter  Medication Sig Dispense Refill  . nystatin (MYCOSTATIN) 100000 UNIT/ML suspension Take 5 mLs (500,000 Units total) by mouth 4 (four) times daily. 140 mL 0  . predniSONE (DELTASONE) 20 MG tablet 4 tabs po qam x 4d, 3 tabs po qam x 3d, 2 tab po qd x 2d, 1 tab po qd x1d 30 tablet 0  . valACYclovir (VALTREX) 1000 MG tablet Take 1 tablet (1,000 mg total) by mouth 3 (three) times daily. 21 tablet 0    Review of Systems: a complete, 10pt review of systems was completed with pertinent positives and negatives as documented in the HPI.   Physical Exam: Vitals:   03/12/17 2153  BP: (!) 163/96  Pulse: 77  Resp: 17  Temp: 98.2 F (36.8 C)   Gen: A&Ox3, no distress  Head: normocephalic, atraumatic, EOMI, anicteric.  Neck: supple without mass or thyromegaly Chest: unlabored respirations, symmetrical air entry  Cardiovascular: RRR with palpable distal pulses, no pedal edema Abdomen: obese, nondistended, soft. Tender in epigastrium and right subcostal margin, mildly tender along medial left subcostal margin. No rebound or guarding. No palpable mass or hernia.  Extremities: warm, without edema, no deformities  Neuro: grossly intact Psych: appropriate mood  and affect, appropriate insight Skin: no lesions or rashes on limited skin exam  CBC Latest Ref Rng & Units 03/12/2017 12/08/2015 01/25/2012  WBC 4.0 - 10.5 K/uL 8.0 8.5 -  Hemoglobin 13.0 - 17.0 g/dL 16.1 09.6 17.3(H)  Hematocrit 39.0 - 52.0 % 44.7 44.3 51.0  Platelets 150 - 400 K/uL 199 - -    CMP Latest Ref Rng & Units 03/12/2017 12/08/2015 01/25/2012  Glucose 65 - 99 mg/dL 045(W) 90 098(J)  BUN 6 - 20 mg/dL 8 11 11   Creatinine 0.61 - 1.24 mg/dL 1.91 4.78 2.95  Sodium 135 - 145 mmol/L 141 142 140  Potassium 3.5 - 5.1 mmol/L 4.3 4.4 3.5  Chloride 101 - 111 mmol/L 105 103 103  CO2 22 - 32 mmol/L 27 29 -  Calcium 8.9 - 10.3  mg/dL 9.2 9.0 -  Total Protein 6.5 - 8.1 g/dL 6.5 6.8 -  Total Bilirubin 0.3 - 1.2 mg/dL 1.0 0.5 -  Alkaline Phos 38 - 126 U/L 83 75 -  AST 15 - 41 U/L 270(H) 20 -  ALT 17 - 63 U/L 165(H) 36 -    Lab Results  Component Value Date   INR 1.00 03/13/2017    Imaging: ULTRASOUND ABDOMEN LIMITED RIGHT UPPER QUADRANT  COMPARISON:  None.  FINDINGS: Gallbladder:  Multiple shadowing echogenic stones present within the gallbladder lumen, largest of which measured 1.4 cm. Gallbladder wall measure within normal limits at 2 mm. Positive sonographic Murphy sign elicited on exam. No free pericholecystic fluid.  Common bile duct:  Diameter: 3 mm.  Liver:  No focal lesion identified. Within normal limits in parenchymal echogenicity.  IMPRESSION: 1. Cholelithiasis with positive sonographic Murphy sign. Clinical correlation for possible acute cholecystitis recommended. 2. No biliary dilatation.  A/P: 26yo obese man with persistent abdominal pain and typical history for biliary colic vs cholecystitis given mild liver enzyme elevation and tenderness. I discussed with him laparoscopic cholecystectomy and the nature of the procedure, typical post-op recovery, and risks of bleeding, infection, pain, scarring, injury to intraabdominal structures specifically the common bile duct and sequelae, conversion to open procedure, and the small chance that he will have persistent symptoms. He expressed understanding and asked several insightful questions all of which were answered. Will admit for symptom control, fluid resuscitation, empiric antibiotics and laparoscopic cholecystectomy with Dr. Lindie Spruce later today.    Phylliss Blakes, MD [facility], [place] Pager [phone]

## 2017-03-13 NOTE — Progress Notes (Signed)
Patient arrived to room 6N22 from ED.  Assessment complete, VS obtained, and Admission database began.

## 2017-03-13 NOTE — Progress Notes (Signed)
[facility].

## 2017-03-13 NOTE — ED Provider Notes (Signed)
[facility]-EMERGENCY DEPT Provider Note   CSN: [id number] Arrival date & time: 03/12/17  2142     History   Chief Complaint Chief Complaint  Patient presents with  . Abdominal Pain    HPI Jermaine Wang is a 26 y.o. male.  Isn't obese 26 year old male who states for the past week.  He's had right upper quadrant pain.  That's made worse with food consumption tonight after eating [employer].  The pain is worse than it ever been.  He has nausea but no vomiting.  Denies any fever      Past Medical History:  Diagnosis Date  . Depression   . Generalized headaches   . GERD (gastroesophageal reflux disease)   . Obesity     Patient Active Problem List   Diagnosis Date Noted  . 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 Depression 11/26/2012    History reviewed. No pertinent surgical history.     Home Medications    Prior to Admission medications   Medication Sig Start Date End Date Taking? Authorizing Provider  nystatin (MYCOSTATIN) 100000 UNIT/ML suspension Take 5 mLs (500,000 Units total) by mouth 4 (four) times daily. 12/08/15   Sherren Mocha, MD  predniSONE (DELTASONE) 20 MG tablet 4 tabs po qam x 4d, 3 tabs po qam x 3d, 2 tab po qd x 2d, 1 tab po qd x1d 12/08/15   Sherren Mocha, MD  valACYclovir (VALTREX) 1000 MG tablet Take 1 tablet (1,000 mg total) by mouth 3 (three) times daily. 12/08/15   Sherren Mocha, MD    Family History Family History  Problem Relation Age of Onset  . Hypertension Mother   . Diabetes Mother   . Heart disease Father   . Hypertension Father   . Cancer Paternal Grandmother   . Stroke Paternal Grandmother     Social History Social History  Substance Use Topics  . Smoking status: Never Smoker  . Smokeless tobacco: Never Used  . Alcohol use No     Allergies   Patient has no known allergies.   Review of Systems Review of Systems  Constitutional: Negative for fever.  Respiratory: Negative for shortness of breath.   Cardiovascular: Negative for chest pain.    Gastrointestinal: Positive for abdominal pain and nausea. Negative for constipation, diarrhea and vomiting.  All other systems reviewed and are negative.    Physical Exam Updated Vital Signs BP (!) 163/96 (BP Location: Right Arm)   Pulse 77   Temp 98.2 F (36.8 C) (Oral)   Resp 17   Ht 5\' 10"  (1.778 m)   Wt (!) 158.8 kg (350 lb)   SpO2 98%   BMI 50.22 kg/m   Physical Exam  Constitutional: He appears well-developed and well-nourished.  HENT:  Head: Normocephalic.  Eyes: Pupils are equal, round, and reactive to light.  Neck: Normal range of motion.  Cardiovascular: Normal rate.   Pulmonary/Chest: Effort normal.  Abdominal: He exhibits no distension. There is tenderness in the right upper quadrant and epigastric area. There is positive Murphy's sign.  Musculoskeletal: Normal range of motion.  Skin: Skin is warm.  Psychiatric: He has a normal mood and affect.  Vitals reviewed.    ED Treatments / Results  Labs (all labs ordered are listed, but only abnormal results are displayed) Labs Reviewed  COMPREHENSIVE METABOLIC PANEL - Abnormal; Notable for the following:       Result Value   Glucose, Bld 114 (*)    AST 270 (*)    ALT 165 (*)  All other components within normal limits  URINALYSIS, ROUTINE W REFLEX MICROSCOPIC - Abnormal; Notable for the following:    APPearance HAZY (*)    All other components within normal limits  LIPASE, BLOOD  CBC  PROTIME-INR    EKG  EKG Interpretation None       Radiology Koreas Abdomen Limited Ruq  Result Date: 03/13/2017 CLINICAL DATA:  Initial evaluation for acute right upper quadrant pain. EXAM: ULTRASOUND ABDOMEN LIMITED RIGHT UPPER QUADRANT COMPARISON:  None. FINDINGS: Gallbladder: Multiple shadowing echogenic stones present within the gallbladder lumen, largest of which measured 1.4 cm. Gallbladder wall measure within normal limits at 2 mm. Positive sonographic Murphy sign elicited on exam. No free pericholecystic fluid.  Common bile duct: Diameter: 3 mm. Liver: No focal lesion identified. Within normal limits in parenchymal echogenicity. IMPRESSION: 1. Cholelithiasis with positive sonographic Murphy sign. Clinical correlation for possible acute cholecystitis recommended. 2. No biliary dilatation. Electronically Signed   By: Rise MuBenjamin  McClintock M.D.   On: 03/13/2017 00:45    Procedures Procedures (including critical care time)  Medications Ordered in ED Medications  0.9 %  sodium chloride infusion ( Intravenous New Bag/Given 03/13/17 0109)     Initial Impression / Assessment and Plan / ED Course  I have reviewed the triage vital signs and the nursing notes.  Pertinent labs & imaging results that were available during my care of the patient were reviewed by me and considered in my medical decision making (see chart for details).      Patient with clinical cholecystitis confirmed by ultrasound.  Also noted to have elevated LFTs with normal white count Dr. Fabio Asaonner-[facility] will evaluate patient At this time patient is not requesting pain control Final Clinical Impressions(s) / ED Diagnoses   Final diagnoses:  RUQ pain  Cholecystitis    New Prescriptions New Prescriptions   No medications on file     Earley FavorSchulz, Caryle Helgeson, NP 03/13/17 0117    Earley FavorSchulz, Lasheena Frieze, NP 03/13/17 [id number]    Zadie RhineWickline, Donald, MD 03/13/17 [id number]

## 2017-03-13 NOTE — Anesthesia Postprocedure Evaluation (Signed)
Anesthesia Post Note  Patient: Jermaine Wang  Procedure(s) Performed: Procedure(s) (LRB): LAPAROSCOPIC CHOLECYSTECTOMY WITH INTRAOPERATIVE CHOLANGIOGRAM (N/A)     Patient location during evaluation: PACU Anesthesia Type: General Level of consciousness: awake and alert Pain management: pain level controlled Vital Signs Assessment: post-procedure vital signs reviewed and stable Respiratory status: spontaneous breathing, nonlabored ventilation, respiratory function stable and patient connected to nasal cannula oxygen Cardiovascular status: blood pressure returned to baseline and stable Postop Assessment: no signs of nausea or vomiting Anesthetic complications: no    Last Vitals:  Vitals:   03/13/17 1615 03/13/17 1630  BP: 125/79 129/82  Pulse: 64 77  Resp: (!) 22 17  Temp:      Last Pain:  Vitals:   03/13/17 1630  TempSrc:   PainSc: 5                  Nadra Hritz A.

## 2017-03-14 ENCOUNTER — Encounter (HOSPITAL_COMMUNITY): Payer: Self-pay | Admitting: General Surgery

## 2017-03-14 MED ORDER — OXYCODONE HCL 5 MG PO TABS: 5.0000 mg | ORAL_TABLET | ORAL | 0 refills | Status: DC | PRN

## 2017-03-14 NOTE — Discharge Instructions (Signed)

## 2017-03-14 NOTE — Progress Notes (Signed)
Discharge instructions reviewed with pt and prescription given.  Pt verbalized understanding and had no questions.  Pt discharged in stable condition via wheelchair.  Hector ShadeMoss, Katrinia Straker WallerLindsay

## 2017-03-14 NOTE — Discharge Summary (Signed)
[facility] Discharge Summary   Patient ID: Jermaine Wang MRN: [id number] DOB/AGE: May 10, 1991 25 y.o.  Admit date: 03/13/2017 Discharge date: 03/14/2017  Admitting Diagnosis: Acute cholecystitis  Discharge Diagnosis Patient Active Problem List   Diagnosis Date Noted  . Acute cholecystitis 03/13/2017  . 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 Depression 11/26/2012    Consultants None  Imaging: Dg Cholangiogram Operative  Result Date: 03/13/2017 CLINICAL DATA:  Calculus cholecystitis EXAM: INTRAOPERATIVE CHOLANGIOGRAM TECHNIQUE: Cholangiographic images from the C-arm fluoroscopic device were submitted for interpretation post-operatively. Please see the procedural report for the amount of contrast and the fluoroscopy time utilized. COMPARISON:  03/13/2017 FINDINGS: Limited intraoperative cholangiogram performed during the laparoscopic procedure. Mild biliary prominence diffusely. Residual cystic duct, common hepatic duct, and common bile duct are patent. Contrast eventually drains into the duodenum. Mobile nonobstructing ill-defined filling defects in the distal CBD suspicious for biliary sludge, less likely choledocholithiasis. IMPRESSION: Mild biliary prominence without obstruction. Nonobstructing ill-defined distal CBD filling defects, favored to represent biliary sludge, over choledocholithiasis. Electronically Signed   By: Judie PetitM.  Shick M.D.   On: 03/13/2017 15:02   Koreas Abdomen Limited Ruq  Result Date: 03/13/2017 CLINICAL DATA:  Initial evaluation for acute right upper quadrant pain. EXAM: ULTRASOUND ABDOMEN LIMITED RIGHT UPPER QUADRANT COMPARISON:  None. FINDINGS: Gallbladder: Multiple shadowing echogenic stones present within the gallbladder lumen, largest of which measured 1.4 cm. Gallbladder wall measure within normal limits at 2 mm. Positive sonographic Murphy sign elicited on exam. No free pericholecystic fluid. Common bile duct: Diameter: 3 mm. Liver: No focal lesion identified.  Within normal limits in parenchymal echogenicity. IMPRESSION: 1. Cholelithiasis with positive sonographic Murphy sign. Clinical correlation for possible acute cholecystitis recommended. 2. No biliary dilatation. Electronically Signed   By: Rise MuBenjamin  McClintock M.D.   On: 03/13/2017 00:45    Procedures Dr. Lindie SpruceWyatt (03/13/17) - [facility]  H[facility] Course:  Jermaine Wang  is a 25yo male [facility] with 2 days of epigastric pain. He has had similar symptoms in the past.  Workup showed symptomatic cholelithiasis and possible cholecystitis.  Patient was admitted and underwent procedure listed above.  Tolerated procedure well and was transferred to the floor.  Diet was advanced as tolerated.  On POD1 the patient was voiding well, tolerating diet, ambulating well, pain well controlled, vital signs stable, incisions c/d/i and felt stable for discharge home.  Patient will follow up in our office in 2-3 weeks and knows to call with questions or concerns.  He will call to confirm appointment date/time.    I have personally reviewed the patients medication history on the [place] controlled substance database.    Physical Exam: General:  Alert, NAD, pleasant, comfortable Pulm: effort normal Abd:  Soft, ND, appropriately tender, multiple lap incisions C/D/I  Allergies as of 03/14/2017   No Known Allergies     Medication List    TAKE these medications   oxyCODONE 5 MG immediate release tablet Commonly known as:  Oxy IR/ROXICODONE Take 1-2 tablets (5-10 mg total) by mouth every 4 (four) hours as needed for moderate pain.        Follow-up Information    [facility] [place] 3 week(s).   Specialty:  General Surgery Why:  Please call as soon as you leave the hospital to make a follow-up appointment in our DOW clinic in 3 weeks to follow-up from your surgery. Contact information: [address] [place]  [id number] [phone]  Signed: Edson Snowball, [facility] 03/14/2017, 7:54 AM Pager: [phone] Consults: [phone] Mon-Fri 7:00 am-4:30 pm Sat-Sun 7:00 am-11:30 am

## 2017-05-28 ENCOUNTER — Ambulatory Visit (HOSPITAL_COMMUNITY)
Admission: EM | Admit: 2017-05-28 | Discharge: 2017-05-28 | Disposition: A | Discharge status: Home / Self Care | Payer: Worker's Compensation | Attending: Family Medicine | Admitting: Family Medicine

## 2017-05-28 ENCOUNTER — Encounter (HOSPITAL_COMMUNITY): Payer: Self-pay | Admitting: Emergency Medicine

## 2017-05-28 ENCOUNTER — Ambulatory Visit (INDEPENDENT_AMBULATORY_CARE_PROVIDER_SITE_OTHER): Payer: Worker's Compensation

## 2017-05-28 DIAGNOSIS — S9032XA Contusion of left foot, initial encounter: Secondary | ICD-10-CM

## 2017-05-28 MED ORDER — HYDROCODONE-ACETAMINOPHEN 5-325 MG PO TABS: 2.0000 | ORAL_TABLET | ORAL | 0 refills | Status: AC | PRN

## 2017-05-28 MED ORDER — HYDROCODONE-ACETAMINOPHEN 5-325 MG PO TABS
2.0000 | ORAL_TABLET | Freq: Once | ORAL | Status: AC
  Administered 2017-05-28: 2 via ORAL

## 2017-05-28 MED ORDER — HYDROCODONE-ACETAMINOPHEN 5-325 MG PO TABS
ORAL_TABLET | ORAL | Status: AC
  Filled 2017-05-28: qty 2
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
## 2017-05-28 NOTE — Discharge Instructions (Signed)
Are no fractures or dislocations seen on the x-ray. You most likely have a contusion to your foot. If your pain worsens, if your foot becomes cold, looses color, these can be a sign of impaired circulation, which can be a medical emergency. If this happens go to the emergency room. Follow-up with occupational health as needed for further evaluation.

## 2017-06-06 ENCOUNTER — Ambulatory Visit: Payer: Self-pay

## 2017-06-06 ENCOUNTER — Other Ambulatory Visit: Payer: Self-pay | Admitting: Occupational Medicine

## 2017-06-06 DIAGNOSIS — M79672 Pain in left foot: Secondary | ICD-10-CM

## 2017-06-09 HISTORY — PX: CHOLECYSTECTOMY: SHX55

## 2017-06-28 ENCOUNTER — Encounter: Payer: Self-pay | Admitting: Internal Medicine

## 2017-09-29 ENCOUNTER — Other Ambulatory Visit: Payer: Self-pay

## 2017-09-29 ENCOUNTER — Emergency Department (HOSPITAL_COMMUNITY)
Admission: EM | Admit: 2017-09-29 | Discharge: 2017-09-29 | Disposition: A | Discharge status: Home / Self Care | Payer: BLUE CROSS/BLUE SHIELD | Attending: Emergency Medicine | Admitting: Emergency Medicine

## 2017-09-29 ENCOUNTER — Emergency Department (HOSPITAL_COMMUNITY): Payer: BLUE CROSS/BLUE SHIELD

## 2017-09-29 ENCOUNTER — Encounter (HOSPITAL_COMMUNITY): Payer: Self-pay

## 2017-09-29 DIAGNOSIS — Y998 Other external cause status: Secondary | ICD-10-CM | POA: Insufficient documentation

## 2017-09-29 DIAGNOSIS — S1191XA Laceration without foreign body of unspecified part of neck, initial encounter: Secondary | ICD-10-CM | POA: Diagnosis present

## 2017-09-29 DIAGNOSIS — Y939 Activity, unspecified: Secondary | ICD-10-CM | POA: Insufficient documentation

## 2017-09-29 DIAGNOSIS — T148XXA Other injury of unspecified body region, initial encounter: Secondary | ICD-10-CM

## 2017-09-29 DIAGNOSIS — S41012A Laceration without foreign body of left shoulder, initial encounter: Secondary | ICD-10-CM | POA: Diagnosis not present

## 2017-09-29 DIAGNOSIS — Y92009 Unspecified place in unspecified non-institutional (private) residence as the place of occurrence of the external cause: Secondary | ICD-10-CM | POA: Diagnosis not present

## 2017-09-29 DIAGNOSIS — S41112A Laceration without foreign body of left upper arm, initial encounter: Secondary | ICD-10-CM

## 2017-09-29 DIAGNOSIS — Z79899 Other long term (current) drug therapy: Secondary | ICD-10-CM | POA: Insufficient documentation

## 2017-09-29 LAB — PREPARE FRESH FROZEN PLASMA
UNIT DIVISION: 0
Unit division: 0

## 2017-09-29 LAB — TYPE AND SCREEN
UNIT DIVISION: 0
Unit division: 0

## 2017-09-29 LAB — BPAM RBC
BLOOD PRODUCT EXPIRATION DATE: 201812282359
Blood Product Expiration Date: 201812282359
ISSUE DATE / TIME: 201812220803
ISSUE DATE / TIME: 201812220803
UNIT TYPE AND RH: 9500
Unit Type and Rh: 9500

## 2017-09-29 LAB — BPAM FFP
BLOOD PRODUCT EXPIRATION DATE: 201812232359
Blood Product Expiration Date: 201812232359
ISSUE DATE / TIME: 201812220805
ISSUE DATE / TIME: 201812220805
UNIT TYPE AND RH: 6200
Unit Type and Rh: 6200

## 2017-09-29 MED ORDER — LIDOCAINE-EPINEPHRINE-TETRACAINE (LET) TOPICAL GEL
3.0000 mL | Freq: Once | TOPICAL | Status: AC
  Administered 2017-09-29: 3 mL via TOPICAL
  Filled 2017-09-29: qty 3

## 2017-09-29 MED ORDER — LIDOCAINE-EPINEPHRINE (PF) 2 %-1:200000 IJ SOLN
20.0000 mL | Freq: Once | INTRAMUSCULAR | Status: AC
  Administered 2017-09-29: 20 mL
  Filled 2017-09-29: qty 20
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
## 2017-09-29 NOTE — ED Notes (Signed)
Pt verbalized understanding of d/c instructions and has no further questions. VSS, NAD. Pt removed all belongings from room.  

## 2017-10-01 ENCOUNTER — Encounter (HOSPITAL_COMMUNITY): Payer: Self-pay | Admitting: Emergency Medicine

## 2017-10-23 ENCOUNTER — Encounter (HOSPITAL_COMMUNITY): Payer: Self-pay | Admitting: Family Medicine

## 2017-10-23 ENCOUNTER — Ambulatory Visit (HOSPITAL_COMMUNITY)
Admission: EM | Admit: 2017-10-23 | Discharge: 2017-10-23 | Disposition: A | Discharge status: Home / Self Care | Payer: BLUE CROSS/BLUE SHIELD | Attending: Family Medicine | Admitting: Family Medicine

## 2017-10-23 DIAGNOSIS — H6693 Otitis media, unspecified, bilateral: Secondary | ICD-10-CM

## 2017-10-23 MED ORDER — AMOXICILLIN 875 MG PO TABS: 875.0000 mg | ORAL_TABLET | Freq: Two times a day (BID) | ORAL | 0 refills | Status: AC

## 2017-10-23 NOTE — Discharge Instructions (Signed)
Push fluids to ensure adequate hydration and keep secretions thin.  °Tylenol and/or ibuprofen as needed for pain or fevers.  °Complete course of antibiotics.  °If symptoms worsen or do not improve in the next week to return to be seen or to follow up with PCP.   °

## 2017-10-23 NOTE — ED Triage Notes (Signed)
Pt here for 4 days of right ear pain, ringing in ears and decreased hearing.

## 2017-10-23 NOTE — ED Provider Notes (Signed)
[facility]    CSN: [id number] Arrival date & time: 10/23/17  1538     History   Chief Complaint Chief Complaint  Patient presents with  . Otalgia    HPI Harolyn RutherfordJesse Schexnider is a 27 y.o. male.   Verdon CumminsJesse presents with complaints of right ear pain which started approximately 4 days ago. He states that prior to ear pain he did have cough and congestion symptoms which he attributed to allergies, these symptoms have since resolved. Without fevers. Tried cleaning the ear and using peroxide to ear which did not help. Did have ear tubes as a child. Rates pain 5/10. Feels his hearing is decreased and with ringing present. Without sore throat or headache. Without drainage from the ear.    ROS per HPI.       Past Medical History:  Diagnosis Date  . Depression   . Generalized headaches   . GERD (gastroesophageal reflux disease)   . Obesity     Patient Active Problem List   Diagnosis Date Noted  . Acute cholecystitis 03/13/2017  . 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 Depression 11/26/2012    Past Surgical History:  Procedure Laterality Date  . CHOLECYSTECTOMY N/A 03/13/2017   Procedure: LAPAROSCOPIC CHOLECYSTECTOMY WITH INTRAOPERATIVE CHOLANGIOGRAM;  Surgeon: Jimmye NormanWyatt, James, MD;  Location: [facility] OR;  Service: General;  Laterality: N/A;  . CHOLECYSTECTOMY  06/2017       Home Medications    Prior to Admission medications   Medication Sig Start Date End Date Taking? Authorizing Provider  amitriptyline (ELAVIL) 50 MG tablet Take 50 mg by mouth at bedtime.    [provider]  amoxicillin (AMOXIL) 875 MG tablet Take 1 tablet (875 mg total) by mouth 2 (two) times daily for 7 days. 10/23/17 10/30/17  Georgetta HaberBurky, Natalie B, NP  HYDROcodone-acetaminophen (NORCO/VICODIN) 5-325 MG tablet Take 2 tablets by mouth every 4 (four) hours as needed. 05/28/17   Dorena BodoKennard, Lawrence, NP    Family History Family History  Problem Relation Age of Onset  . Hypertension Mother   . Diabetes Mother   .  Heart disease Father   . Hypertension Father   . Cancer Paternal Grandmother   . Stroke Paternal Grandmother     Social History Social History   Tobacco Use  . Smoking status: Never Smoker  . Smokeless tobacco: Never Used  Substance Use Topics  . Alcohol use: No    Frequency: Never  . Drug use: No     Allergies   Patient has no known allergies.   Review of Systems Review of Systems   Physical Exam Triage Vital Signs ED Triage Vitals  Enc Vitals Group     BP 10/23/17 1557 133/85     Pulse Rate 10/23/17 1557 68     Resp 10/23/17 1557 18     Temp 10/23/17 1557 98.3 F (36.8 C)     Temp src --      SpO2 10/23/17 1557 95 %     Weight --      Height --      Head Circumference --      Peak Flow --      Pain Score 10/23/17 1556 7     Pain Loc --      Pain Edu? --      Excl. in GC? --    No data found.  Updated Vital Signs BP 133/85   Pulse 68   Temp 98.3 F (36.8 C)   Resp 18   SpO2  95%   Visual Acuity Right Eye Distance:   Left Eye Distance:   Bilateral Distance:    Right Eye Near:   Left Eye Near:    Bilateral Near:     Physical Exam  Constitutional: He is oriented to person, place, and time. He appears well-developed and well-nourished.  HENT:  Head: Normocephalic and atraumatic.  Right Ear: External ear and ear canal normal. Tympanic membrane is injected, scarred and bulging.  Left Ear: External ear and ear canal normal. Tympanic membrane is injected and scarred.  Nose: Nose normal. Right sinus exhibits no maxillary sinus tenderness and no frontal sinus tenderness. Left sinus exhibits no maxillary sinus tenderness and no frontal sinus tenderness.  Mouth/Throat: Uvula is midline, oropharynx is clear and moist and mucous membranes are normal.  Eyes: Conjunctivae are normal. Pupils are equal, round, and reactive to light.  Neck: Normal range of motion.  Cardiovascular: Normal rate and regular rhythm.  Pulmonary/Chest: Effort normal and breath  sounds normal.  Lymphadenopathy:    He has no cervical adenopathy.  Neurological: He is alert and oriented to person, place, and time.  Skin: Skin is warm and dry.  Vitals reviewed.    UC Treatments / Results  Labs (all labs ordered are listed, but only abnormal results are displayed) Labs Reviewed - No data to display  EKG  EKG Interpretation None       Radiology No results found.  Procedures Procedures (including critical care time)  Medications Ordered in UC Medications - No data to display   Initial Impression / Assessment and Plan / UC Course  I have reviewed the triage vital signs and the nursing notes.  Pertinent labs & imaging results that were available during my care of the patient were reviewed by me and considered in my medical decision making (see chart for details).     Complete course of antibiotics. Push fluids. Tylenol and/or ibuprofen as needed for pain. If symptoms worsen or do not improve in the next week to return to be seen or to follow up with PCP.  Patient verbalized understanding and agreeable to plan.    Final Clinical Impressions(s) / UC Diagnoses   Final diagnoses:  Bilateral otitis media, unspecified otitis media type    ED Discharge Orders        Ordered    amoxicillin (AMOXIL) 875 MG tablet  2 times daily     10/23/17 1604       Controlled Substance Prescriptions [place] Controlled Substance Registry consulted? Not Applicable   Georgetta Haber, NP 10/23/17 1609

## 2019-04-18 IMAGING — CR DG CHEST 2V
2 series · 2 of 2 positions shown · non-contrast
Comparison: None.

CLINICAL DATA: Per [REDACTED], Pt has Puncture wound to left side of
neck, 2 puncture wounds to side of chest

EXAM:
CHEST  2 VIEW

[chest lat]
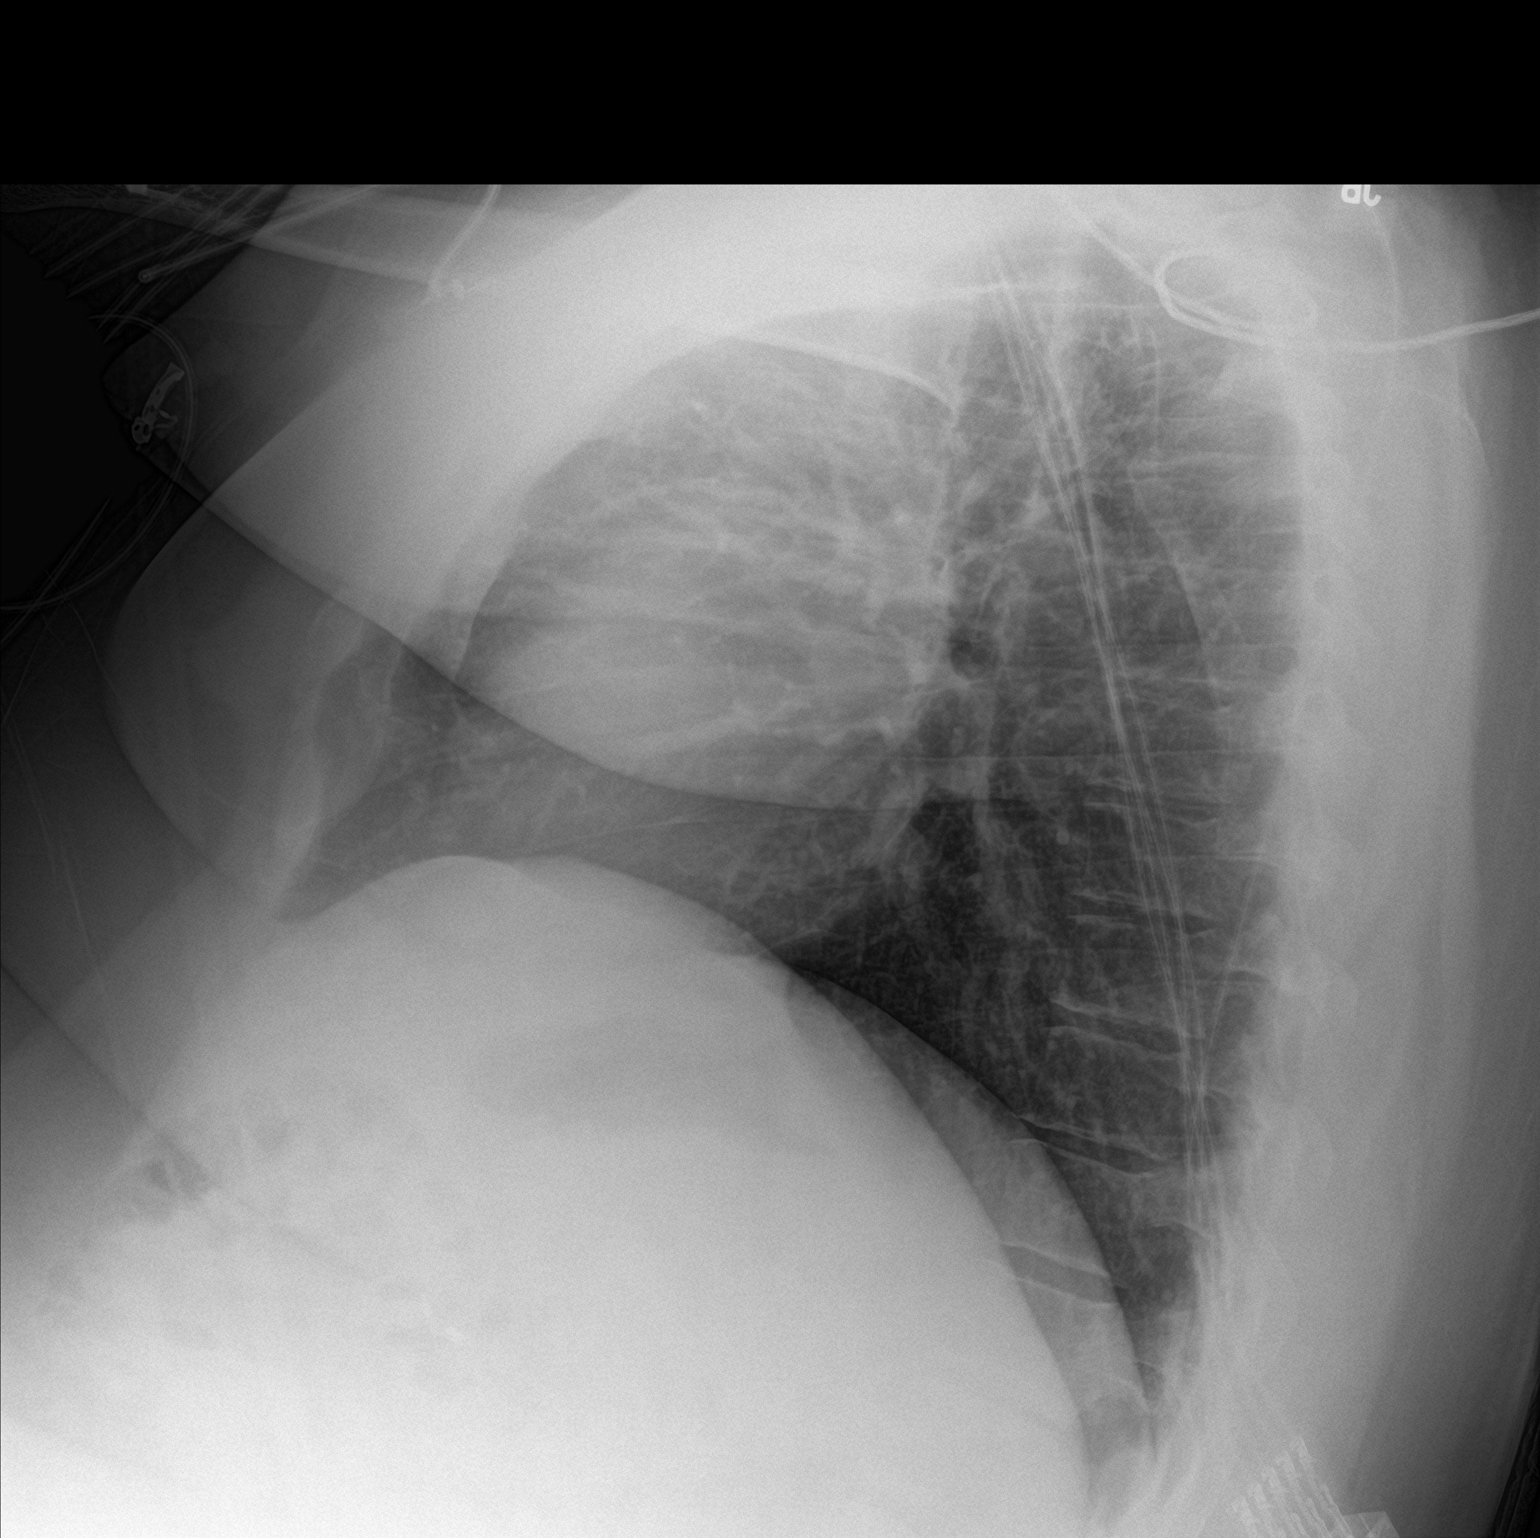

[chest ap]
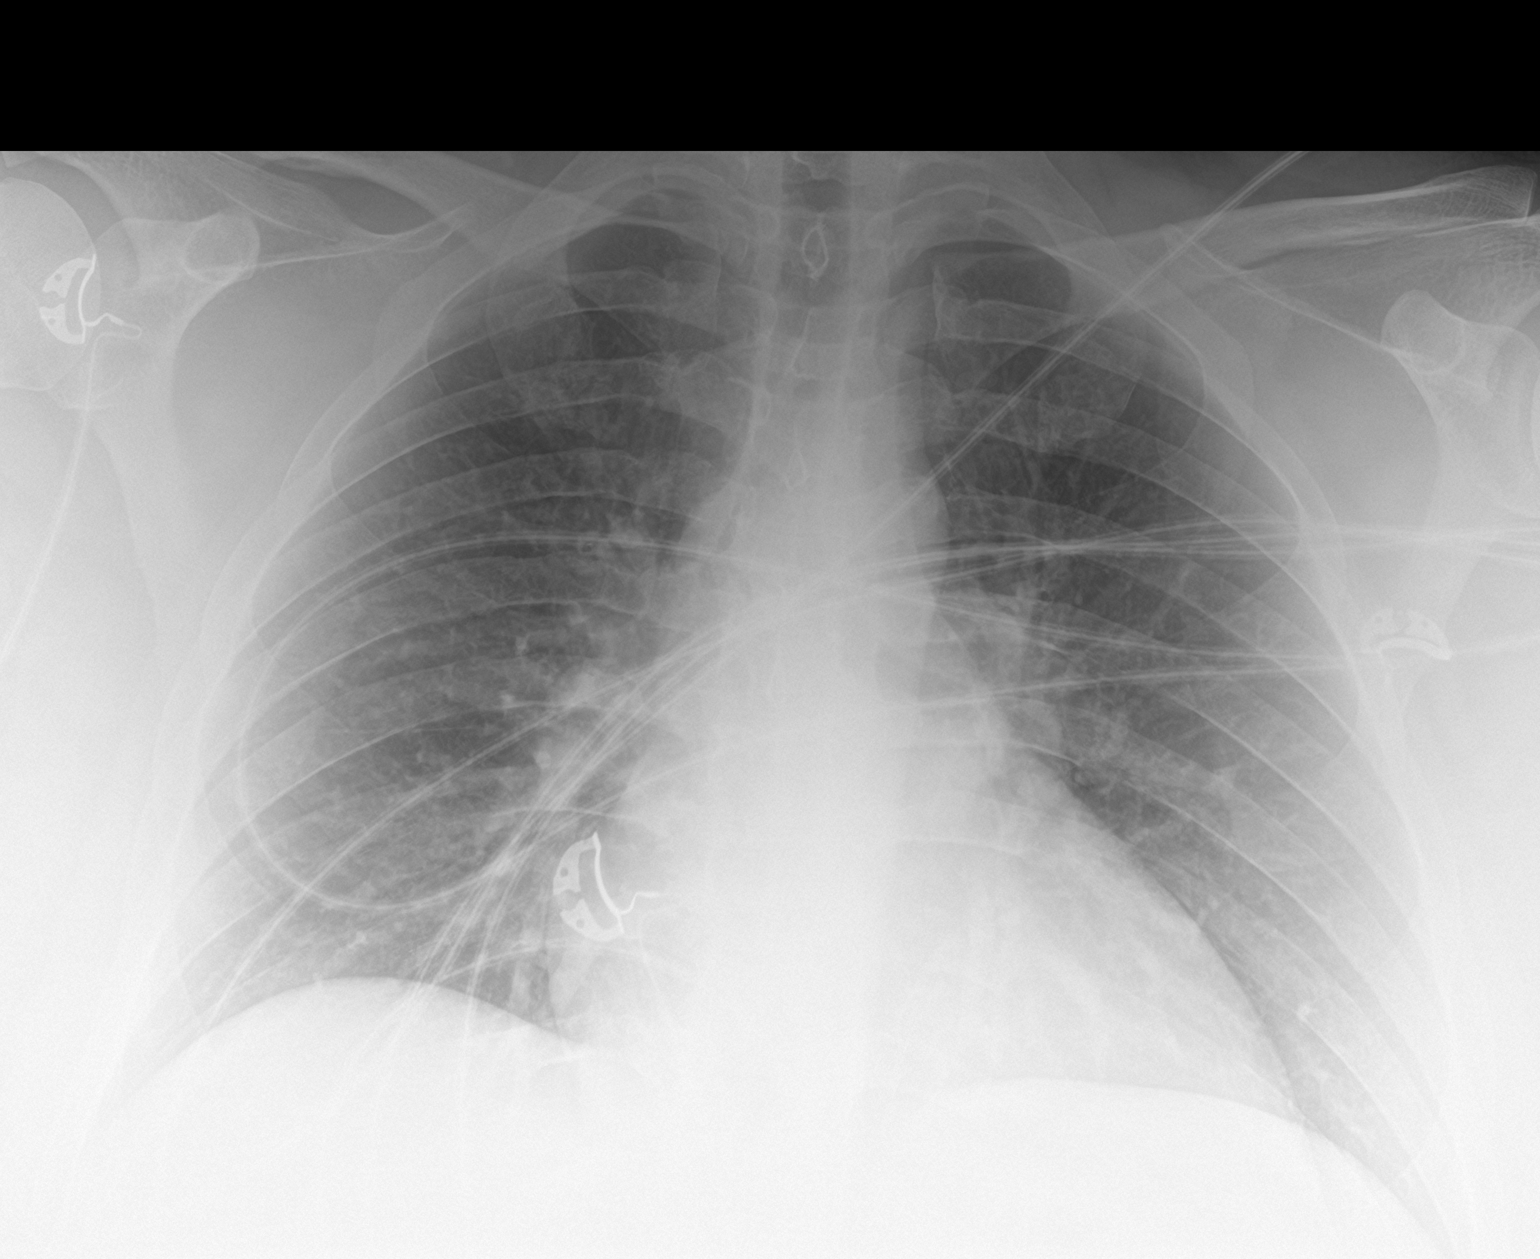

[2 of 2 positions shown; findings below may reference images not displayed]

FINDINGS: Cardiac silhouette is normal in size and configuration. Normal
mediastinal and hilar contours.

Lungs are clear.

No pleural effusion or pneumothorax.

Skeletal structures and overlying soft tissues are unremarkable.
IMPRESSION: No active cardiopulmonary disease.
